# Patient Record
Sex: Female | Born: 1935 | Race: Black or African American | Hispanic: No | Marital: Single | Smoking: Never smoker
Health system: Southern US, Community
[De-identification: ages and names within clinical notes are randomized; demographics above are authoritative.]

## PROBLEM LIST (undated history)

## (undated) DIAGNOSIS — E785 Hyperlipidemia, unspecified: Secondary | ICD-10-CM

## (undated) DIAGNOSIS — R21 Rash and other nonspecific skin eruption: Secondary | ICD-10-CM

## (undated) DIAGNOSIS — D649 Anemia, unspecified: Secondary | ICD-10-CM

## (undated) DIAGNOSIS — I10 Essential (primary) hypertension: Secondary | ICD-10-CM

## (undated) DIAGNOSIS — M1990 Unspecified osteoarthritis, unspecified site: Secondary | ICD-10-CM

## (undated) DIAGNOSIS — E119 Type 2 diabetes mellitus without complications: Secondary | ICD-10-CM

## (undated) HISTORY — PX: HAND SURGERY: SHX662

## (undated) HISTORY — PX: BREAST LUMPECTOMY: SHX2

## (undated) MED FILL — Ferumoxytol Inj 510 MG/17ML (30 MG/ML) (Elemental Fe): INTRAVENOUS | Qty: 17 | Status: AC

---

## 2015-01-23 DIAGNOSIS — M25561 Pain in right knee: Secondary | ICD-10-CM | POA: Diagnosis not present

## 2015-01-27 ENCOUNTER — Other Ambulatory Visit: Payer: Self-pay | Admitting: Surgical

## 2015-02-03 ENCOUNTER — Other Ambulatory Visit: Payer: Self-pay | Admitting: Surgical

## 2015-02-06 NOTE — Patient Instructions (Addendum)
Michelle KiefKatie L Allen  02/06/2015   Your procedure is scheduled on: 02/13/15   [facility] and follow signs to               Short Stay Center at 8:30 AM.   Call this number if you have problems the morning of surgery [phone]   Remember:  Do not eat food or drink liquids :After Midnight.     Take these medicines the morning of surgery with A SIP OF WATER: MAY TAKE TRAMADOL IF NEEDED FOR PAIN                               You may not have any metal on your body including hair pins and              piercings  Do not wear jewelry, make-up, lotions, powders or perfumes.             Do not wear nail polish.  Do not shave  48 hours prior to surgery.              Men may shave face and neck.   Do not bring valuables to the hospital. [facility] IS NOT             RESPONSIBLE   FOR VALUABLES.  Contacts, dentures or bridgework may not be worn into surgery.  Leave suitcase in the car. After surgery it may be brought to your room.     Patients discharged the day of surgery will not be allowed to drive home.  Name and phone number of your driver:  Special Instructions: N/A              Please read over the following fact sheets you were given: _____________________________________________________________________                                                      - PREPARING FOR SURGERY  Before surgery, you can play an important role.  Because skin is not sterile, your skin needs to be as free of germs as possible.  You can reduce the number of germs on your skin by washing with CHG (chlorahexidine gluconate) soap before surgery.  CHG is an antiseptic cleaner which kills germs and bonds with the skin to continue killing germs even after washing. Please DO NOT use if you have an allergy to CHG or antibacterial soaps.  If your skin becomes reddened/irritated stop using the CHG and inform your nurse when you arrive at Short Stay. Do  not shave (including legs and underarms) for at least 48 hours prior to the first CHG shower.  You may shave your face. Please follow these instructions carefully:   1.  Shower with CHG Soap the night before surgery and the  morning of Surgery.   2.  If you choose to wash your hair, wash your hair first as usual with your  normal  Shampoo.   3.  After you shampoo, rinse your hair and body thoroughly to remove the  shampoo.  4.  Use CHG as you would any other liquid soap.  You can apply chg directly  to the skin and wash . Gently wash with scrungie or clean wascloth    5.  Apply the CHG Soap to your body ONLY FROM THE NECK DOWN.   Do not use on open                           Wound or open sores. Avoid contact with eyes, ears mouth and genitals (private parts).                        Genitals (private parts) with your normal soap.              6.  Wash thoroughly, paying special attention to the area where your surgery  will be performed.   7.  Thoroughly rinse your body with warm water from the neck down.   8.  DO NOT shower/wash with your normal soap after using and rinsing off  the CHG Soap .                9.  Pat yourself dry with a clean towel.             10.  Wear clean pajamas.             11.  Place clean sheets on your bed the night of your first shower and do not  sleep with pets.  Day of Surgery : Do not apply any lotions/deodorants the morning of surgery.  Please wear clean clothes to the hospital/surgery center.  FAILURE TO FOLLOW THESE INSTRUCTIONS MAY RESULT IN THE CANCELLATION OF YOUR SURGERY    PATIENT SIGNATURE_________________________________  ______________________________________________________________________     Michelle Allen  An incentive spirometer is a tool that can help keep your lungs clear and active. This tool measures how well you are filling your lungs with each breath. Taking long deep breaths  may help reverse or decrease the chance of developing breathing (pulmonary) problems (especially infection) following:  A long period of time when you are unable to move or be active. BEFORE THE PROCEDURE   If the spirometer includes an indicator to show your best effort, your nurse or respiratory therapist will set it to a desired goal.  If possible, sit up straight or lean slightly forward. Try not to slouch.  Hold the incentive spirometer in an upright position. INSTRUCTIONS FOR USE   Sit on the edge of your bed if possible, or sit up as far as you can in bed or on a chair.  Hold the incentive spirometer in an upright position.  Breathe out normally.  Place the mouthpiece in your mouth and seal your lips tightly around it.  Breathe in slowly and as deeply as possible, raising the piston or the ball toward the top of the column.  Hold your breath for 3-5 seconds or for as long as possible. Allow the piston or ball to fall to the bottom of the column.  Remove the mouthpiece from your mouth and breathe out normally.  Rest for a few seconds and repeat Steps 1 through 7 at least 10 times every 1-2 hours when you are awake. Take your time and take a few normal breaths between deep breaths.  The spirometer may include an indicator to show your best effort. Use the indicator as a goal to work toward during  each repetition.  After each set of 10 deep breaths, practice coughing to be sure your lungs are clear. If you have an incision (the cut made at the time of surgery), support your incision when coughing by placing a pillow or rolled up towels firmly against it. Once you are able to get out of bed, walk around indoors and cough well. You may stop using the incentive spirometer when instructed by your caregiver.  RISKS AND COMPLICATIONS  Take your time so you do not get dizzy or light-headed.  If you are in pain, you may need to take or ask for pain medication before doing incentive  spirometry. It is harder to take a deep breath if you are having pain. AFTER USE  Rest and breathe slowly and easily.  It can be helpful to keep track of a log of your progress. Your caregiver can provide you with a simple table to help with this. If you are using the spirometer at home, follow these instructions: SEEK MEDICAL CARE IF:   You are having difficultly using the spirometer.  You have trouble using the spirometer as often as instructed.  Your pain medication is not giving enough relief while using the spirometer.  You develop fever of 100.5 F (38.1 C) or higher. SEEK IMMEDIATE MEDICAL CARE IF:   You cough up bloody sputum that had not been present before.  You develop fever of 102 F (38.9 C) or greater.  You develop worsening pain at or near the incision site. MAKE SURE YOU:   Understand these instructions.  Will watch your condition.  Will get help right away if you are not doing well or get worse. Document Released: 04/17/2007 Document Revised: 02/27/2012 Document Reviewed: 06/18/2007 ExitCare Patient Information 2014 [employer], [place].   ________________________________________________________________________  WHAT IS A BLOOD TRANSFUSION? Blood Transfusion Information  A transfusion is the replacement of blood or some of its parts. Blood is made up of multiple cells which provide different functions.  Red blood cells carry oxygen and are used for blood loss replacement.  White blood cells fight against infection.  Platelets control bleeding.  Plasma helps clot blood.  Other blood products are available for specialized needs, such as hemophilia or other clotting disorders. BEFORE THE TRANSFUSION  Who gives blood for transfusions?   Healthy volunteers who are fully evaluated to make sure their blood is safe. This is blood bank blood. Transfusion therapy is the safest it has ever been in the practice of medicine. Before blood is taken from a donor, a  complete history is taken to make sure that person has no history of diseases nor engages in risky social behavior (examples are intravenous drug use or sexual activity with multiple partners). The donor's travel history is screened to minimize risk of transmitting infections, such as malaria. The donated blood is tested for signs of infectious diseases, such as HIV and hepatitis. The blood is then tested to be sure it is compatible with you in order to minimize the chance of a transfusion reaction. If you or a relative donates blood, this is often done in anticipation of surgery and is not appropriate for emergency situations. It takes many days to process the donated blood. RISKS AND COMPLICATIONS Although transfusion therapy is very safe and saves many lives, the main dangers of transfusion include:   Getting an infectious disease.  Developing a transfusion reaction. This is an allergic reaction to something in the blood you were given. Every precaution is taken to prevent  this. The decision to have a blood transfusion has been considered carefully by your caregiver before blood is given. Blood is not given unless the benefits outweigh the risks. AFTER THE TRANSFUSION  Right after receiving a blood transfusion, you will usually feel much better and more energetic. This is especially true if your red blood cells have gotten low (anemic). The transfusion raises the level of the red blood cells which carry oxygen, and this usually causes an energy increase.  The nurse administering the transfusion will monitor you carefully for complications. HOME CARE INSTRUCTIONS  No special instructions are needed after a transfusion. You may find your energy is better. Speak with your caregiver about any limitations on activity for underlying diseases you may have. SEEK MEDICAL CARE IF:   Your condition is not improving after your transfusion.  You develop redness or irritation at the intravenous (IV)  site. SEEK IMMEDIATE MEDICAL CARE IF:  Any of the following symptoms occur over the next 12 hours:  Shaking chills.  You have a temperature by mouth above 102 F (38.9 C), not controlled by medicine.  Chest, back, or muscle pain.  People around you feel you are not acting correctly or are confused.  Shortness of breath or difficulty breathing.  Dizziness and fainting.  You get a rash or develop hives.  You have a decrease in urine output.  Your urine turns a dark color or changes to pink, red, or brown. Any of the following symptoms occur over the next 10 days:  You have a temperature by mouth above 102 F (38.9 C), not controlled by medicine.  Shortness of breath.  Weakness after normal activity.  The white part of the eye turns yellow (jaundice).  You have a decrease in the amount of urine or are urinating less often.  Your urine turns a dark color or changes to pink, red, or brown. Document Released: 12/02/2000 Document Revised: 02/27/2012 Document Reviewed: 07/21/2008 [facility] Patient Information 2014 [place], [place].  _______________________________________________________________________

## 2015-02-09 ENCOUNTER — Encounter (HOSPITAL_COMMUNITY)
Admission: RE | Admit: 2015-02-09 | Discharge: 2015-02-09 | Disposition: A | Discharge status: Home / Self Care | Payer: PRIVATE HEALTH INSURANCE | Source: Ambulatory Visit | Attending: Orthopedic Surgery | Admitting: Orthopedic Surgery

## 2015-02-09 ENCOUNTER — Encounter (HOSPITAL_COMMUNITY): Payer: Self-pay

## 2015-02-09 ENCOUNTER — Ambulatory Visit (HOSPITAL_COMMUNITY)
Admission: RE | Admit: 2015-02-09 | Discharge: 2015-02-09 | Disposition: A | Discharge status: Home / Self Care | Payer: PRIVATE HEALTH INSURANCE | Source: Ambulatory Visit | Attending: Surgical | Admitting: Surgical

## 2015-02-09 DIAGNOSIS — Z01818 Encounter for other preprocedural examination: Secondary | ICD-10-CM | POA: Diagnosis not present

## 2015-02-09 DIAGNOSIS — D509 Iron deficiency anemia, unspecified: Secondary | ICD-10-CM | POA: Diagnosis not present

## 2015-02-09 DIAGNOSIS — I700 Atherosclerosis of aorta: Secondary | ICD-10-CM | POA: Insufficient documentation

## 2015-02-09 DIAGNOSIS — Z01812 Encounter for preprocedural laboratory examination: Secondary | ICD-10-CM | POA: Insufficient documentation

## 2015-02-09 DIAGNOSIS — I10 Essential (primary) hypertension: Secondary | ICD-10-CM | POA: Diagnosis not present

## 2015-02-09 DIAGNOSIS — E1149 Type 2 diabetes mellitus with other diabetic neurological complication: Secondary | ICD-10-CM | POA: Diagnosis not present

## 2015-02-09 HISTORY — DX: Type 2 diabetes mellitus without complications: E11.9

## 2015-02-09 HISTORY — DX: Rash and other nonspecific skin eruption: R21

## 2015-02-09 HISTORY — DX: Unspecified osteoarthritis, unspecified site: M19.90

## 2015-02-09 HISTORY — DX: Essential (primary) hypertension: I10

## 2015-02-09 HISTORY — DX: Anemia, unspecified: D64.9

## 2015-02-09 HISTORY — DX: Hyperlipidemia, unspecified: E78.5

## 2015-02-09 LAB — URINALYSIS, ROUTINE W REFLEX MICROSCOPIC
Bilirubin Urine: NEGATIVE
Glucose, UA: NEGATIVE mg/dL
Hgb urine dipstick: NEGATIVE
Ketones, ur: NEGATIVE mg/dL
Leukocytes, UA: NEGATIVE
Nitrite: NEGATIVE
Protein, ur: NEGATIVE mg/dL
Specific Gravity, Urine: 1.018 (ref 1.005–1.030)
Urobilinogen, UA: 1 mg/dL (ref 0.0–1.0)
pH: 7 (ref 5.0–8.0)

## 2015-02-09 LAB — COMPREHENSIVE METABOLIC PANEL
ALT: 16 U/L (ref 0–35)
AST: 25 U/L (ref 0–37)
Albumin: 4 g/dL (ref 3.5–5.2)
Alkaline Phosphatase: 94 U/L (ref 39–117)
Anion gap: 6 (ref 5–15)
BUN: 26 mg/dL — ABNORMAL HIGH (ref 6–23)
CO2: 25 mmol/L (ref 19–32)
Calcium: 9 mg/dL (ref 8.4–10.5)
Chloride: 106 mmol/L (ref 96–112)
Creatinine, Ser: 1.2 mg/dL — ABNORMAL HIGH (ref 0.50–1.10)
GFR calc Af Amer: 48 mL/min — ABNORMAL LOW (ref >90)
GFR calc non Af Amer: 42 mL/min — ABNORMAL LOW (ref >90)
Glucose, Bld: 85 mg/dL (ref 70–99)
Potassium: 3.8 mmol/L (ref 3.5–5.1)
Sodium: 137 mmol/L (ref 135–145)
Total Bilirubin: 0.5 mg/dL (ref 0.3–1.2)
Total Protein: 7.8 g/dL (ref 6.0–8.3)

## 2015-02-09 LAB — SURGICAL PCR SCREEN
MRSA, PCR: POSITIVE — AB
STAPHYLOCOCCUS AUREUS: POSITIVE — AB

## 2015-02-09 LAB — CBC WITH DIFFERENTIAL/PLATELET
Basophils Absolute: 0 10*3/uL (ref 0.0–0.1)
Basophils Relative: 0 % (ref 0–1)
Eosinophils Absolute: 0.1 10*3/uL (ref 0.0–0.7)
Eosinophils Relative: 2 % (ref 0–5)
HCT: 33.6 % — ABNORMAL LOW (ref 36.0–46.0)
Hemoglobin: 10.8 g/dL — ABNORMAL LOW (ref 12.0–15.0)
Lymphocytes Relative: 33 % (ref 12–46)
Lymphs Abs: 2 10*3/uL (ref 0.7–4.0)
MCH: 26.3 pg (ref 26.0–34.0)
MCHC: 32.1 g/dL (ref 30.0–36.0)
MCV: 82 fL (ref 78.0–100.0)
Monocytes Absolute: 0.7 10*3/uL (ref 0.1–1.0)
Monocytes Relative: 11 % (ref 3–12)
Neutro Abs: 3.2 10*3/uL (ref 1.7–7.7)
Neutrophils Relative %: 54 % (ref 43–77)
Platelets: 252 10*3/uL (ref 150–400)
RBC: 4.1 MIL/uL (ref 3.87–5.11)
RDW: 14.8 % (ref 11.5–15.5)
WBC: 5.9 10*3/uL (ref 4.0–10.5)

## 2015-02-09 LAB — APTT: aPTT: 27 seconds (ref 24–37)

## 2015-02-09 LAB — PROTIME-INR
INR: 1.04 (ref 0.00–1.49)
Prothrombin Time: 13.7 seconds (ref 11.6–15.2)

## 2015-02-09 LAB — ABO/RH: ABO/RH(D): B POS

## 2015-02-10 NOTE — Progress Notes (Signed)
Instructions given to son for pt to get Rx mupuricin at [facility]. Rx called to pharmacy

## 2015-02-10 NOTE — H&P (Signed)
TOTAL KNEE ADMISSION H&P  Patient is being admitted for right total knee arthroplasty.  Subjective:  Chief Complaint:right knee pain.  HPI: Michelle Allen, 81 y.o. female, has a history of pain and functional disability in the right knee due to arthritis and has failed non-surgical conservative treatments for greater than 12 weeks to includeNSAID's and/or analgesics, corticosteriod injections, flexibility and strengthening excercises and activity modification.  Onset of symptoms was gradual, starting 5 years ago with gradually worsening course since that time. The patient noted no past surgery on the right knee(s).  Patient currently rates pain in the right knee(s) at 8 out of 10 with activity. Patient has night pain, worsening of pain with activity and weight bearing, pain that interferes with activities of daily living, pain with passive range of motion, crepitus and joint swelling.  Patient has evidence of periarticular osteophytes and joint space narrowing by imaging studies. There is no active infection.  Past Medical History  Diagnosis Date  . Hypertension   . Hyperlipidemia   . Arthritis   . Rash     rt side of neck  . Anemia   . Diabetes mellitus without complication     Past Surgical History  Procedure Laterality Date  . Cesarean section    . Hand surgery  44 yrs ago    left  . Breast lumpectomy  44 yrs ago    benign     Current outpatient prescriptions:  .  ferrous fumarate (HEMOCYTE - 106 MG FE) 325 (106 FE) MG TABS tablet, Take 1 tablet by mouth daily., Disp: , Rfl:  .  glimepiride (AMARYL) 4 MG tablet, Take 4 mg by mouth daily with breakfast., Disp: , Rfl:  .  losartan-hydrochlorothiazide (HYZAAR) 100-25 MG per tablet, Take 1 tablet by mouth every morning., Disp: , Rfl:  .  meclizine (ANTIVERT) 25 MG tablet, Take 25 mg by mouth 3 (three) times daily as needed for dizziness., Disp: , Rfl:  .  Omega-3 Fatty Acids (OMEGA 3 PO), Take 1 capsule by mouth daily., Disp: , Rfl:   .  traMADol (ULTRAM) 50 MG tablet, Take 50 mg by mouth every 6 (six) hours as needed for moderate pain., Disp: , Rfl:   Allergies  Allergen Reactions  . Penicillins Rash    History  Substance Use Topics  . Smoking status: Never Smoker   . Smokeless tobacco: Not on file  . Alcohol Use: Yes     Comment: very rare     Review of Systems  Constitutional: Positive for diaphoresis. Negative for fever, chills, weight loss and malaise/fatigue.  HENT: Negative.   Eyes: Negative.   Respiratory: Positive for shortness of breath. Negative for cough, hemoptysis, sputum production and wheezing.   Cardiovascular: Negative.   Gastrointestinal: Negative.   Genitourinary: Negative.   Musculoskeletal: Positive for joint pain. Negative for myalgias, back pain, falls and neck pain.       Right knee pain  Skin: Negative.   Neurological: Positive for dizziness. Negative for tingling, tremors, sensory change, speech change, focal weakness, seizures, loss of consciousness and weakness.  Endo/Heme/Allergies: Negative.   Psychiatric/Behavioral: Negative.     Objective:  Physical Exam  Constitutional: She is oriented to person, place, and time. She appears well-developed and well-nourished. No distress.  HENT:  Head: Normocephalic and atraumatic.  Right Ear: External ear normal.  Left Ear: External ear normal.  Nose: Nose normal.  Mouth/Throat: Oropharynx is clear and moist.  Eyes: Conjunctivae and EOM are normal.  Neck: Normal  range of motion. Neck supple.  Cardiovascular: Normal rate, regular rhythm, normal heart sounds and intact distal pulses.   No murmur heard. Respiratory: Effort normal and breath sounds normal. No respiratory distress. She has no wheezes.  GI: Soft. Bowel sounds are normal. She exhibits no distension. There is no tenderness.  Musculoskeletal:       Right hip: Normal.       Left hip: Normal.       Right knee: She exhibits decreased range of motion, swelling and effusion.  She exhibits no erythema. Tenderness found. Medial joint line and lateral joint line tenderness noted.       Left knee: Normal.       Right lower leg: She exhibits no tenderness and no swelling.       Left lower leg: She exhibits no tenderness and no swelling.  Neurological: She is alert and oriented to person, place, and time. She has normal strength and normal reflexes. No sensory deficit.  Skin: No rash noted. She is not diaphoretic. No erythema.  Psychiatric: She has a normal mood and affect. Her behavior is normal.    Vitals  Weight: 153 lb Height: 62in Body Surface Area: 1.71 m Body Mass Index: 27.98 kg/m  Pulse: 76 (Regular)  BP: 132/78 (Sitting, Left Arm, Standard)  Imaging Review Plain radiographs demonstrate severe degenerative joint disease of the right knee(s). The overall alignment ismild varus. The bone quality appears to be good for age and reported activity level.  Assessment/Plan:  End stage primary osteoarthritis, right knee   The patient history, physical examination, clinical judgment of the provider and imaging studies are consistent with end stage degenerative joint disease of the right knee(s) and total knee arthroplasty is deemed medically necessary. The treatment options including medical management, injection therapy arthroscopy and arthroplasty were discussed at length. The risks and benefits of total knee arthroplasty were presented and reviewed. The risks due to aseptic loosening, infection, stiffness, patella tracking problems, thromboembolic complications and other imponderables were discussed. The patient acknowledged the explanation, agreed to proceed with the plan and consent was signed. Patient is being admitted for inpatient treatment for surgery, pain control, PT, OT, prophylactic antibiotics, VTE prophylaxis, progressive ambulation and ADL's and discharge planning. The patient is planning to be discharged to skilled nursing facility ([facility])    TXA IV  PCP: Amy Moon    Dimitri Ped, [place]

## 2015-02-11 NOTE — Progress Notes (Signed)
Medical clearance note Michelle Allen gnp on chart for 02-13-15 surgery

## 2015-02-13 ENCOUNTER — Encounter (HOSPITAL_COMMUNITY): Payer: Self-pay | Admitting: Certified Registered"

## 2015-02-13 ENCOUNTER — Inpatient Hospital Stay (HOSPITAL_COMMUNITY)
Admission: RE | Admit: 2015-02-13 | Discharge: 2015-02-16 | DRG: 470 | Disposition: A | Discharge status: Skilled Nursing Facility | Payer: PRIVATE HEALTH INSURANCE | Source: Ambulatory Visit | Attending: Orthopedic Surgery | Admitting: Orthopedic Surgery

## 2015-02-13 ENCOUNTER — Inpatient Hospital Stay (HOSPITAL_COMMUNITY): Payer: PRIVATE HEALTH INSURANCE | Admitting: Anesthesiology

## 2015-02-13 ENCOUNTER — Encounter (HOSPITAL_COMMUNITY)
Admission: RE | Disposition: A | Discharge status: Skilled Nursing Facility | Payer: Self-pay | Source: Ambulatory Visit | Attending: Orthopedic Surgery

## 2015-02-13 DIAGNOSIS — E785 Hyperlipidemia, unspecified: Secondary | ICD-10-CM | POA: Diagnosis not present

## 2015-02-13 DIAGNOSIS — D649 Anemia, unspecified: Secondary | ICD-10-CM | POA: Diagnosis not present

## 2015-02-13 DIAGNOSIS — Z79899 Other long term (current) drug therapy: Secondary | ICD-10-CM

## 2015-02-13 DIAGNOSIS — M1711 Unilateral primary osteoarthritis, right knee: Secondary | ICD-10-CM | POA: Diagnosis not present

## 2015-02-13 DIAGNOSIS — E119 Type 2 diabetes mellitus without complications: Secondary | ICD-10-CM | POA: Diagnosis present

## 2015-02-13 DIAGNOSIS — R21 Rash and other nonspecific skin eruption: Secondary | ICD-10-CM | POA: Diagnosis not present

## 2015-02-13 DIAGNOSIS — Z96659 Presence of unspecified artificial knee joint: Secondary | ICD-10-CM

## 2015-02-13 DIAGNOSIS — M179 Osteoarthritis of knee, unspecified: Secondary | ICD-10-CM | POA: Diagnosis not present

## 2015-02-13 DIAGNOSIS — I10 Essential (primary) hypertension: Secondary | ICD-10-CM | POA: Diagnosis present

## 2015-02-13 DIAGNOSIS — Z96651 Presence of right artificial knee joint: Secondary | ICD-10-CM | POA: Diagnosis not present

## 2015-02-13 DIAGNOSIS — Z471 Aftercare following joint replacement surgery: Secondary | ICD-10-CM | POA: Diagnosis not present

## 2015-02-13 DIAGNOSIS — M1990 Unspecified osteoarthritis, unspecified site: Secondary | ICD-10-CM | POA: Diagnosis not present

## 2015-02-13 DIAGNOSIS — M25561 Pain in right knee: Secondary | ICD-10-CM | POA: Diagnosis present

## 2015-02-13 HISTORY — PX: TOTAL KNEE ARTHROPLASTY: SHX125

## 2015-02-13 LAB — GLUCOSE, CAPILLARY
GLUCOSE-CAPILLARY: 183 mg/dL — AB (ref 70–99)
GLUCOSE-CAPILLARY: 200 mg/dL — AB (ref 70–99)
Glucose-Capillary: 84 mg/dL (ref 70–99)
Glucose-Capillary: 168 mg/dL — ABNORMAL HIGH (ref 70–99)

## 2015-02-13 LAB — TYPE AND SCREEN
ABO/RH(D): B POS
Antibody Screen: NEGATIVE

## 2015-02-13 SURGERY — ARTHROPLASTY, KNEE, TOTAL
Anesthesia: General | Site: Knee | Laterality: Right

## 2015-02-13 MED ORDER — ACETAMINOPHEN 10 MG/ML IV SOLN
1000.0000 mg | Freq: Once | INTRAVENOUS | Status: AC
  Administered 2015-02-13: 1000 mg via INTRAVENOUS
  Filled 2015-02-13: qty 100

## 2015-02-13 MED ORDER — LACTATED RINGERS IV SOLN
INTRAVENOUS | Status: DC
  Administered 2015-02-13 (×2): via INTRAVENOUS

## 2015-02-13 MED ORDER — LIDOCAINE HCL (PF) 2 % IJ SOLN
INTRAMUSCULAR | Status: DC | PRN
  Administered 2015-02-13: 20 mg via INTRADERMAL

## 2015-02-13 MED ORDER — PROPOFOL 10 MG/ML IV BOLUS
INTRAVENOUS | Status: DC | PRN
  Administered 2015-02-13: 100 mg via INTRAVENOUS

## 2015-02-13 MED ORDER — ACETAMINOPHEN 325 MG PO TABS: 650.0000 mg | ORAL_TABLET | Freq: Four times a day (QID) | ORAL | Status: DC | PRN

## 2015-02-13 MED ORDER — LOSARTAN POTASSIUM-HCTZ 100-25 MG PO TABS: 1.0000 | ORAL_TABLET | Freq: Every morning | ORAL | Status: DC

## 2015-02-13 MED ORDER — ONDANSETRON HCL 4 MG/2ML IJ SOLN
INTRAMUSCULAR | Status: AC
  Filled 2015-02-13: qty 2

## 2015-02-13 MED ORDER — ONDANSETRON HCL 4 MG/2ML IJ SOLN: 4.0000 mg | Freq: Four times a day (QID) | INTRAMUSCULAR | Status: DC | PRN

## 2015-02-13 MED ORDER — HYDROCODONE-ACETAMINOPHEN 5-325 MG PO TABS: 1.0000 | ORAL_TABLET | ORAL | Status: DC | PRN

## 2015-02-13 MED ORDER — BUPIVACAINE HCL (PF) 0.25 % IJ SOLN
INTRAMUSCULAR | Status: DC | PRN
  Administered 2015-02-13: 20 mL

## 2015-02-13 MED ORDER — ONDANSETRON HCL 4 MG/2ML IJ SOLN
INTRAMUSCULAR | Status: DC | PRN
  Administered 2015-02-13: 4 mg via INTRAVENOUS

## 2015-02-13 MED ORDER — HYDROMORPHONE HCL 2 MG/ML IJ SOLN
INTRAMUSCULAR | Status: AC
  Filled 2015-02-13: qty 1

## 2015-02-13 MED ORDER — POLYETHYLENE GLYCOL 3350 17 G PO PACK: 17.0000 g | PACK | Freq: Every day | ORAL | Status: DC | PRN

## 2015-02-13 MED ORDER — HYDROMORPHONE HCL 1 MG/ML IJ SOLN
1.0000 mg | INTRAMUSCULAR | Status: DC | PRN
  Administered 2015-02-13: 1 mg via INTRAVENOUS
  Filled 2015-02-13: qty 1

## 2015-02-13 MED ORDER — VANCOMYCIN HCL IN DEXTROSE 1-5 GM/200ML-% IV SOLN
1000.0000 mg | Freq: Two times a day (BID) | INTRAVENOUS | Status: AC
  Administered 2015-02-13: 1000 mg via INTRAVENOUS
  Filled 2015-02-13: qty 200

## 2015-02-13 MED ORDER — SUCCINYLCHOLINE CHLORIDE 20 MG/ML IJ SOLN
INTRAMUSCULAR | Status: DC | PRN
  Administered 2015-02-13: 80 mg via INTRAVENOUS

## 2015-02-13 MED ORDER — THROMBIN 5000 UNITS EX SOLR
OROMUCOSAL | Status: DC | PRN
  Administered 2015-02-13: 12:00:00 via TOPICAL

## 2015-02-13 MED ORDER — VANCOMYCIN HCL 1000 MG IV SOLR
1000.0000 mg | Freq: Once | INTRAVENOUS | Status: AC
  Administered 2015-02-13: 1000 mg via INTRAVENOUS

## 2015-02-13 MED ORDER — PROPOFOL 10 MG/ML IV BOLUS
INTRAVENOUS | Status: AC
  Filled 2015-02-13: qty 20

## 2015-02-13 MED ORDER — HYDROMORPHONE HCL 1 MG/ML IJ SOLN: 0.2500 mg | INTRAMUSCULAR | Status: DC | PRN

## 2015-02-13 MED ORDER — MECLIZINE HCL 25 MG PO TABS
25.0000 mg | ORAL_TABLET | Freq: Three times a day (TID) | ORAL | Status: DC | PRN
  Filled 2015-02-13: qty 1

## 2015-02-13 MED ORDER — ACETAMINOPHEN 650 MG RE SUPP: 650.0000 mg | Freq: Four times a day (QID) | RECTAL | Status: DC | PRN

## 2015-02-13 MED ORDER — CEFAZOLIN SODIUM-DEXTROSE 2-3 GM-% IV SOLR: 2.0000 g | INTRAVENOUS | Status: DC

## 2015-02-13 MED ORDER — HYDROCHLOROTHIAZIDE 25 MG PO TABS
25.0000 mg | ORAL_TABLET | Freq: Every day | ORAL | Status: DC
  Administered 2015-02-13 – 2015-02-16 (×4): 25 mg via ORAL
  Filled 2015-02-13 (×4): qty 1

## 2015-02-13 MED ORDER — CELECOXIB 200 MG PO CAPS
200.0000 mg | ORAL_CAPSULE | Freq: Two times a day (BID) | ORAL | Status: DC
  Administered 2015-02-13 – 2015-02-16 (×5): 200 mg via ORAL
  Filled 2015-02-13 (×7): qty 1

## 2015-02-13 MED ORDER — FENTANYL CITRATE 0.05 MG/ML IJ SOLN
INTRAMUSCULAR | Status: DC | PRN
  Administered 2015-02-13: 100 ug via INTRAVENOUS

## 2015-02-13 MED ORDER — TRANEXAMIC ACID 100 MG/ML IV SOLN
1000.0000 mg | INTRAVENOUS | Status: AC
  Administered 2015-02-13: 1000 mg via INTRAVENOUS
  Filled 2015-02-13: qty 10

## 2015-02-13 MED ORDER — VANCOMYCIN HCL IN DEXTROSE 1-5 GM/200ML-% IV SOLN
INTRAVENOUS | Status: AC
  Filled 2015-02-13: qty 200

## 2015-02-13 MED ORDER — METHOCARBAMOL 500 MG PO TABS
500.0000 mg | ORAL_TABLET | Freq: Four times a day (QID) | ORAL | Status: DC | PRN
  Administered 2015-02-16: 500 mg via ORAL
  Filled 2015-02-13: qty 1

## 2015-02-13 MED ORDER — FENTANYL CITRATE 0.05 MG/ML IJ SOLN: 25.0000 ug | INTRAMUSCULAR | Status: DC | PRN

## 2015-02-13 MED ORDER — DEXAMETHASONE SODIUM PHOSPHATE 10 MG/ML IJ SOLN
INTRAMUSCULAR | Status: AC
  Filled 2015-02-13: qty 1

## 2015-02-13 MED ORDER — BUPIVACAINE LIPOSOME 1.3 % IJ SUSP
20.0000 mL | Freq: Once | INTRAMUSCULAR | Status: AC
  Administered 2015-02-13: 20 mL
  Filled 2015-02-13: qty 20

## 2015-02-13 MED ORDER — ALUM & MAG HYDROXIDE-SIMETH 200-200-20 MG/5ML PO SUSP: 30.0000 mL | ORAL | Status: DC | PRN

## 2015-02-13 MED ORDER — SODIUM CHLORIDE 0.9 % IJ SOLN
INTRAMUSCULAR | Status: AC
  Filled 2015-02-13: qty 50

## 2015-02-13 MED ORDER — SODIUM CHLORIDE 0.9 % IR SOLN
Status: DC | PRN
  Administered 2015-02-13: 1000 mL

## 2015-02-13 MED ORDER — FENTANYL CITRATE 0.05 MG/ML IJ SOLN
INTRAMUSCULAR | Status: AC
  Filled 2015-02-13: qty 2

## 2015-02-13 MED ORDER — ONDANSETRON HCL 4 MG PO TABS: 4.0000 mg | ORAL_TABLET | Freq: Four times a day (QID) | ORAL | Status: DC | PRN

## 2015-02-13 MED ORDER — BUPIVACAINE HCL (PF) 0.25 % IJ SOLN
INTRAMUSCULAR | Status: AC
  Filled 2015-02-13: qty 30

## 2015-02-13 MED ORDER — BISACODYL 5 MG PO TBEC: 5.0000 mg | DELAYED_RELEASE_TABLET | Freq: Every day | ORAL | Status: DC | PRN

## 2015-02-13 MED ORDER — RIVAROXABAN 10 MG PO TABS
10.0000 mg | ORAL_TABLET | Freq: Every day | ORAL | Status: DC
  Administered 2015-02-14 – 2015-02-16 (×3): 10 mg via ORAL
  Filled 2015-02-13 (×4): qty 1

## 2015-02-13 MED ORDER — GLIMEPIRIDE 4 MG PO TABS
4.0000 mg | ORAL_TABLET | Freq: Every day | ORAL | Status: DC
  Administered 2015-02-14 – 2015-02-16 (×3): 4 mg via ORAL
  Filled 2015-02-13 (×4): qty 1

## 2015-02-13 MED ORDER — FERROUS SULFATE 325 (65 FE) MG PO TABS
325.0000 mg | ORAL_TABLET | Freq: Three times a day (TID) | ORAL | Status: DC
  Administered 2015-02-13 – 2015-02-16 (×8): 325 mg via ORAL
  Filled 2015-02-13 (×11): qty 1

## 2015-02-13 MED ORDER — INSULIN ASPART 100 UNIT/ML ~~LOC~~ SOLN
0.0000 [IU] | Freq: Three times a day (TID) | SUBCUTANEOUS | Status: DC
  Administered 2015-02-13: 3 [IU] via SUBCUTANEOUS
  Administered 2015-02-14 – 2015-02-15 (×3): 2 [IU] via SUBCUTANEOUS

## 2015-02-13 MED ORDER — METHOCARBAMOL 1000 MG/10ML IJ SOLN
500.0000 mg | Freq: Four times a day (QID) | INTRAVENOUS | Status: DC | PRN
  Administered 2015-02-13: 500 mg via INTRAVENOUS
  Filled 2015-02-13 (×2): qty 5

## 2015-02-13 MED ORDER — SODIUM CHLORIDE 0.9 % IR SOLN
Status: DC | PRN
  Administered 2015-02-13: 500 mL

## 2015-02-13 MED ORDER — CHLORHEXIDINE GLUCONATE 4 % EX LIQD: 60.0000 mL | Freq: Once | CUTANEOUS | Status: DC

## 2015-02-13 MED ORDER — OXYCODONE-ACETAMINOPHEN 5-325 MG PO TABS
2.0000 | ORAL_TABLET | ORAL | Status: DC | PRN
  Administered 2015-02-13 – 2015-02-16 (×5): 2 via ORAL
  Filled 2015-02-13 (×6): qty 2

## 2015-02-13 MED ORDER — HYDROMORPHONE HCL 1 MG/ML IJ SOLN
INTRAMUSCULAR | Status: DC | PRN
  Administered 2015-02-13 (×2): 1 mg via INTRAVENOUS

## 2015-02-13 MED ORDER — MIDAZOLAM HCL 5 MG/5ML IJ SOLN
INTRAMUSCULAR | Status: DC | PRN
  Administered 2015-02-13 (×2): 1 mg via INTRAVENOUS

## 2015-02-13 MED ORDER — LOSARTAN POTASSIUM 50 MG PO TABS
100.0000 mg | ORAL_TABLET | Freq: Every day | ORAL | Status: DC
  Administered 2015-02-13 – 2015-02-16 (×4): 100 mg via ORAL
  Filled 2015-02-13 (×4): qty 2

## 2015-02-13 MED ORDER — SODIUM CHLORIDE 0.9 % IJ SOLN
INTRAMUSCULAR | Status: DC | PRN
  Administered 2015-02-13: 20 mL via INTRAVENOUS

## 2015-02-13 MED ORDER — LACTATED RINGERS IV SOLN
INTRAVENOUS | Status: DC
  Administered 2015-02-13 – 2015-02-14 (×2): via INTRAVENOUS

## 2015-02-13 MED ORDER — MENTHOL 3 MG MT LOZG: 1.0000 | LOZENGE | OROMUCOSAL | Status: DC | PRN

## 2015-02-13 MED ORDER — FLEET ENEMA 7-19 GM/118ML RE ENEM: 1.0000 | ENEMA | Freq: Once | RECTAL | Status: AC | PRN

## 2015-02-13 MED ORDER — PHENOL 1.4 % MT LIQD: 1.0000 | OROMUCOSAL | Status: DC | PRN

## 2015-02-13 MED ORDER — LACTATED RINGERS IV SOLN: INTRAVENOUS | Status: DC

## 2015-02-13 MED ORDER — DEXAMETHASONE SODIUM PHOSPHATE 10 MG/ML IJ SOLN
INTRAMUSCULAR | Status: DC | PRN
  Administered 2015-02-13: 10 mg via INTRAVENOUS

## 2015-02-13 MED ORDER — ROCURONIUM BROMIDE 100 MG/10ML IV SOLN
INTRAVENOUS | Status: DC | PRN
  Administered 2015-02-13: 20 mg via INTRAVENOUS

## 2015-02-13 MED ORDER — MIDAZOLAM HCL 2 MG/2ML IJ SOLN
INTRAMUSCULAR | Status: AC
  Filled 2015-02-13: qty 2

## 2015-02-13 MED ORDER — THROMBIN 5000 UNITS EX SOLR
CUTANEOUS | Status: AC
  Filled 2015-02-13: qty 5000

## 2015-02-13 MED ORDER — FERROUS FUMARATE 325 (106 FE) MG PO TABS: 1.0000 | ORAL_TABLET | Freq: Every day | ORAL | Status: DC

## 2015-02-13 SURGICAL SUPPLY — 74 items
BAG DECANTER FOR FLEXI CONT (MISCELLANEOUS) ×3 IMPLANT
BAG ZIPLOCK 12X15 (MISCELLANEOUS) IMPLANT
BANDAGE ELASTIC 4 VELCRO ST LF (GAUZE/BANDAGES/DRESSINGS) ×3 IMPLANT
BANDAGE ELASTIC 6 VELCRO ST LF (GAUZE/BANDAGES/DRESSINGS) ×3 IMPLANT
BANDAGE ESMARK 6X9 LF (GAUZE/BANDAGES/DRESSINGS) ×1 IMPLANT
BLADE SAG 18X100X1.27 (BLADE) ×3 IMPLANT
BLADE SAW SGTL 11.0X1.19X90.0M (BLADE) ×3 IMPLANT
BNDG ESMARK 6X9 LF (GAUZE/BANDAGES/DRESSINGS) ×3
BONE CEMENT GENTAMICIN (Cement) ×3 IMPLANT
CAP KNEE TOTAL 3 SIGMA ×3 IMPLANT
CEMENT BONE GENTAMICIN 40 (Cement) ×1 IMPLANT
CUFF TOURN SGL QUICK 34 (TOURNIQUET CUFF) ×2
CUFF TRNQT CYL 34X4X40X1 (TOURNIQUET CUFF) ×1 IMPLANT
DERMABOND ADVANCED (GAUZE/BANDAGES/DRESSINGS) ×2
DERMABOND ADVANCED .7 DNX12 (GAUZE/BANDAGES/DRESSINGS) ×1 IMPLANT
DRAPE EXTREMITY T 121X128X90 (DRAPE) ×3 IMPLANT
DRAPE INCISE IOBAN 66X45 STRL (DRAPES) IMPLANT
DRAPE POUCH INSTRU U-SHP 10X18 (DRAPES) ×3 IMPLANT
DRAPE U-SHAPE 47X51 STRL (DRAPES) ×3 IMPLANT
DRSG AQUACEL AG ADV 3.5X10 (GAUZE/BANDAGES/DRESSINGS) ×3 IMPLANT
DRSG OPSITE POSTOP 3X4 (GAUZE/BANDAGES/DRESSINGS) ×3 IMPLANT
DRSG PAD ABDOMINAL 8X10 ST (GAUZE/BANDAGES/DRESSINGS) IMPLANT
DRSG TEGADERM 4X4.75 (GAUZE/BANDAGES/DRESSINGS) ×3 IMPLANT
DURAPREP 26ML APPLICATOR (WOUND CARE) ×3 IMPLANT
ELECT REM PT RETURN 9FT ADLT (ELECTROSURGICAL) ×3
ELECTRODE REM PT RTRN 9FT ADLT (ELECTROSURGICAL) ×1 IMPLANT
EVACUATOR 1/8 PVC DRAIN (DRAIN) ×3 IMPLANT
FACESHIELD WRAPAROUND (MASK) ×21 IMPLANT
GAUZE SPONGE 2X2 8PLY STRL LF (GAUZE/BANDAGES/DRESSINGS) ×1 IMPLANT
GLOVE BIOGEL PI IND STRL 6.5 (GLOVE) ×1 IMPLANT
GLOVE BIOGEL PI IND STRL 8 (GLOVE) ×1 IMPLANT
GLOVE BIOGEL PI INDICATOR 6.5 (GLOVE) ×2
GLOVE BIOGEL PI INDICATOR 8 (GLOVE) ×2
GLOVE ECLIPSE 8.0 STRL XLNG CF (GLOVE) ×6 IMPLANT
GLOVE SURG SS PI 6.5 STRL IVOR (GLOVE) ×3 IMPLANT
GOWN STRL REUS W/TWL LRG LVL3 (GOWN DISPOSABLE) ×3 IMPLANT
GOWN STRL REUS W/TWL XL LVL3 (GOWN DISPOSABLE) ×3 IMPLANT
HANDPIECE INTERPULSE COAX TIP (DISPOSABLE) ×2
IMMOBILIZER KNEE 20 (SOFTGOODS) ×3
IMMOBILIZER KNEE 20 THIGH 36 (SOFTGOODS) ×1 IMPLANT
KIT BASIN OR (CUSTOM PROCEDURE TRAY) ×3 IMPLANT
LIQUID BAND (GAUZE/BANDAGES/DRESSINGS) ×3 IMPLANT
MANIFOLD NEPTUNE II (INSTRUMENTS) ×3 IMPLANT
NDL SAFETY ECLIPSE 18X1.5 (NEEDLE) ×1 IMPLANT
NEEDLE HYPO 18GX1.5 SHARP (NEEDLE) ×2
NEEDLE HYPO 22GX1.5 SAFETY (NEEDLE) ×3 IMPLANT
NS IRRIG 1000ML POUR BTL (IV SOLUTION) IMPLANT
PACK TOTAL JOINT (CUSTOM PROCEDURE TRAY) ×3 IMPLANT
PADDING CAST COTTON 6X4 STRL (CAST SUPPLIES) IMPLANT
PEN SKIN MARKING BROAD (MISCELLANEOUS) ×3 IMPLANT
POSITIONER SURGICAL ARM (MISCELLANEOUS) ×3 IMPLANT
SET HNDPC FAN SPRY TIP SCT (DISPOSABLE) ×1 IMPLANT
SET PAD KNEE POSITIONER (MISCELLANEOUS) ×3 IMPLANT
SPONGE GAUZE 2X2 STER 10/PKG (GAUZE/BANDAGES/DRESSINGS) ×2
SPONGE LAP 18X18 X RAY DECT (DISPOSABLE) IMPLANT
SPONGE SURGIFOAM ABS GEL 100 (HEMOSTASIS) ×3 IMPLANT
STAPLER VISISTAT 35W (STAPLE) IMPLANT
SUCTION FRAZIER 12FR DISP (SUCTIONS) ×3 IMPLANT
SUT BONE WAX W31G (SUTURE) ×3 IMPLANT
SUT MNCRL AB 4-0 PS2 18 (SUTURE) ×3 IMPLANT
SUT VIC AB 1 CT1 27 (SUTURE) ×4
SUT VIC AB 1 CT1 27XBRD ANTBC (SUTURE) ×2 IMPLANT
SUT VIC AB 2-0 CT1 27 (SUTURE) ×6
SUT VIC AB 2-0 CT1 TAPERPNT 27 (SUTURE) ×3 IMPLANT
SUT VLOC 180 0 24IN GS25 (SUTURE) ×3 IMPLANT
SYR 20CC LL (SYRINGE) ×9 IMPLANT
SYR 50ML LL SCALE MARK (SYRINGE) IMPLANT
TOWEL OR 17X26 10 PK STRL BLUE (TOWEL DISPOSABLE) ×3 IMPLANT
TOWEL OR NON WOVEN STRL DISP B (DISPOSABLE) ×3 IMPLANT
TOWER CARTRIDGE SMART MIX (DISPOSABLE) ×3 IMPLANT
TRAY FOLEY CATH 14FRSI W/METER (CATHETERS) ×3 IMPLANT
WATER STERILE IRR 1500ML POUR (IV SOLUTION) ×3 IMPLANT
WRAP KNEE MAXI GEL POST OP (GAUZE/BANDAGES/DRESSINGS) ×3 IMPLANT
YANKAUER SUCT BULB TIP 10FT TU (MISCELLANEOUS) ×3 IMPLANT

## 2015-02-13 NOTE — Brief Op Note (Signed)
02/13/2015  12:22 PM  PATIENT:  Michelle Allen  81 y.o. female  PRE-OPERATIVE DIAGNOSIS:Primary  OSTEOARTHRITIS OF RIGHT KNEE  POST-OPERATIVE DIAGNOSIS: Primary OSTEOARTHRITIS OF RIGHT KNEE  PROCEDURE:  Procedure(s): RIGHT TOTAL KNEE ARTHROPLASTY (Right)  SURGEON:  Surgeon(s) and Role:    * Jacki Conesonald A Cuong Moorman, MD - Primary  PHYSICIAN ASSISTANT: Dimitri PedAmber Constable PA  ASSISTANTS: Dimitri PedAmber Constable PA  ANESTHESIA:   general  EBL:  Total I/O In: 250 [IV Piggyback:250] Out: -   BLOOD ADMINISTERED:none  DRAINS: (one) Hemovact drain(s) in the Right Knee with  Suction Open   LOCAL MEDICATIONS USED:  MARCAINE20cc of 0.50% pLAIN AND 20CC OF EXPAREL MIXED WITH 20CC OF NORMAL SALINE.  Marland Kitchen.   SPECIMEN:  No Specimen  DISPOSITION OF SPECIMEN:  N/A  COUNTS:  YES  TOURNIQUET:  * Missing tourniquet times found for documented tourniquets in log:  [id number] *  DICTATION: .Other Dictation: Dictation Number [id number]  PLAN OF CARE: Admit to inpatient   PATIENT DISPOSITION:  Stable in OR   Delay start of Pharmacological VTE agent (>24hrs) due to surgical blood loss or risk of bleeding: yes

## 2015-02-13 NOTE — Interval H&P Note (Signed)
History and Physical Interval Note:  02/13/2015 9:47 AM  Michelle Allen  has presented today for surgery, with the diagnosis of OA OF RIGHT KNEE  The various methods of treatment have been discussed with the patient and family. After consideration of risks, benefits and other options for treatment, the patient has consented to  Procedure(s): RIGHT TOTAL KNEE ARTHROPLASTY (Right) as a surgical intervention .  The patient's history has been reviewed, patient examined, no change in status, stable for surgery.  I have reviewed the patient's chart and labs.  Questions were answered to the patient's satisfaction.     [name]

## 2015-02-13 NOTE — Anesthesia Postprocedure Evaluation (Signed)
  Anesthesia Post-op Note  Patient: Michelle Allen  Procedure(s) Performed: Procedure(s) (LRB): RIGHT TOTAL KNEE ARTHROPLASTY (Right)  Patient Location: PACU  Anesthesia Type: General  Level of Consciousness: awake and alert   Airway and Oxygen Therapy: Patient Spontanous Breathing  Post-op Pain: mild  Post-op Assessment: Post-op Vital signs reviewed, Patient's Cardiovascular Status Stable, Respiratory Function Stable, Patent Airway and No signs of Nausea or vomiting  Last Vitals:  Filed Vitals:   02/13/15 1532  BP: 161/64  Pulse: 77  Temp: 36.7 C  Resp: 14    Post-op Vital Signs: stable   Complications: No apparent anesthesia complications

## 2015-02-13 NOTE — Progress Notes (Signed)
Utilization review completed.  

## 2015-02-13 NOTE — Anesthesia Procedure Notes (Addendum)
Procedure Name: Intubation Date/Time: 02/13/2015 10:31 AM Performed by: Early OsmondEARGLE, Caidin Heidenreich E Pre-anesthesia Checklist: Patient identified, Emergency Drugs available, Suction available and Patient being monitored Patient Re-evaluated:Patient Re-evaluated prior to inductionOxygen Delivery Method: Circle System Utilized Preoxygenation: Pre-oxygenation with 100% oxygen Intubation Type: IV induction Ventilation: Mask ventilation without difficulty Laryngoscope Size: Glidescope and 3 Grade View: Grade I Tube type: Oral Tube size: 7.0 mm Number of attempts: 2 Airway Equipment and Method: Stylet and Oral airway Placement Confirmation: ETT inserted through vocal cords under direct vision,  positive ETCO2 and breath sounds checked- equal and bilateral Secured at: 21 cm Tube secured with: Tape Dental Injury: Teeth and Oropharynx as per pre-operative assessment  Comments: Limited view with Miller #3, large epiglottis and anterior airway, unable to pass boughie or ett with view, glidescope used, grade 1 view, ett passed easily. Easy mask ventilation, atraumatic intubation.

## 2015-02-13 NOTE — Transfer of Care (Signed)
Immediate Anesthesia Transfer of Care Note  Patient: Michelle Allen  Procedure(s) Performed: Procedure(s) (LRB): RIGHT TOTAL KNEE ARTHROPLASTY (Right)  Patient Location: PACU  Anesthesia Type: General  Level of Consciousness: sedated, patient cooperative and responds to stimulation  Airway & Oxygen Therapy: Patient Spontanous Breathing and Patient connected to face mask oxgen  Post-op Assessment: Report given to PACU RN and Post -op Vital signs reviewed and stable  Post vital signs: Reviewed and stable  Complications: No apparent anesthesia complications

## 2015-02-13 NOTE — Anesthesia Preprocedure Evaluation (Signed)
Anesthesia Evaluation  Patient identified by MRN, date of birth, ID band Patient awake    Reviewed: Allergy & Precautions, H&P , NPO status , Patient's Chart, lab work & pertinent test results  Airway Mallampati: II  TM Distance: >3 FB Neck ROM: full    Dental  (+) Chipped, Dental Advisory Given   Pulmonary neg pulmonary ROS,  breath sounds clear to auscultation  Pulmonary exam normal       Cardiovascular Exercise Tolerance: Good hypertension, Pt. on medications Rhythm:regular Rate:Normal     Neuro/Psych negative neurological ROS  negative psych ROS   GI/Hepatic negative GI ROS, Neg liver ROS,   Endo/Other  diabetes, Well Controlled, Type 2, Oral Hypoglycemic Agents  Renal/GU negative Renal ROS  negative genitourinary   Musculoskeletal  (+) Arthritis -,   Abdominal   Peds  Hematology negative hematology ROS (+) anemia , hgb 10.8   Anesthesia Other Findings   Reproductive/Obstetrics negative OB ROS                             Anesthesia Physical Anesthesia Plan  ASA: III  Anesthesia Plan: General   Post-op Pain Management:    Induction: Intravenous  Airway Management Planned: Oral ETT  Additional Equipment:   Intra-op Plan:   Post-operative Plan: Extubation in OR  Informed Consent: I have reviewed the patients History and Physical, chart, labs and discussed the procedure including the risks, benefits and alternatives for the proposed anesthesia with the patient or authorized representative who has indicated his/her understanding and acceptance.   Dental Advisory Given  Plan Discussed with: CRNA and Surgeon  Anesthesia Plan Comments:         Anesthesia Quick Evaluation

## 2015-02-14 LAB — BASIC METABOLIC PANEL
ANION GAP: 5 (ref 5–15)
BUN: 26 mg/dL — ABNORMAL HIGH (ref 6–23)
CALCIUM: 9 mg/dL (ref 8.4–10.5)
CO2: 25 mmol/L (ref 19–32)
CREATININE: 1.09 mg/dL (ref 0.50–1.10)
Chloride: 102 mmol/L (ref 96–112)
GFR calc non Af Amer: 47 mL/min — ABNORMAL LOW (ref >90)
GFR, EST AFRICAN AMERICAN: 54 mL/min — AB (ref >90)
GLUCOSE: 204 mg/dL — AB (ref 70–99)
Potassium: 4.6 mmol/L (ref 3.5–5.1)
SODIUM: 132 mmol/L — AB (ref 135–145)

## 2015-02-14 LAB — GLUCOSE, CAPILLARY
GLUCOSE-CAPILLARY: 98 mg/dL (ref 70–99)
Glucose-Capillary: 123 mg/dL — ABNORMAL HIGH (ref 70–99)
Glucose-Capillary: 124 mg/dL — ABNORMAL HIGH (ref 70–99)

## 2015-02-14 LAB — CBC
HEMATOCRIT: 28 % — AB (ref 36.0–46.0)
HEMOGLOBIN: 9.2 g/dL — AB (ref 12.0–15.0)
MCH: 26.7 pg (ref 26.0–34.0)
MCHC: 32.9 g/dL (ref 30.0–36.0)
MCV: 81.4 fL (ref 78.0–100.0)
Platelets: 203 10*3/uL (ref 150–400)
RBC: 3.44 MIL/uL — AB (ref 3.87–5.11)
RDW: 14.8 % (ref 11.5–15.5)
WBC: 11.7 10*3/uL — AB (ref 4.0–10.5)

## 2015-02-14 MED ORDER — METHOCARBAMOL 500 MG PO TABS: 500.0000 mg | ORAL_TABLET | Freq: Four times a day (QID) | ORAL | Status: DC | PRN

## 2015-02-14 MED ORDER — OXYCODONE HCL 5 MG PO TABS
10.0000 mg | ORAL_TABLET | ORAL | Status: DC | PRN
  Administered 2015-02-14 – 2015-02-16 (×5): 10 mg via ORAL
  Filled 2015-02-14 (×6): qty 2

## 2015-02-14 MED ORDER — OXYCODONE-ACETAMINOPHEN 5-325 MG PO TABS: 1.0000 | ORAL_TABLET | ORAL | Status: DC | PRN

## 2015-02-14 MED ORDER — RIVAROXABAN 10 MG PO TABS: 10.0000 mg | ORAL_TABLET | Freq: Every day | ORAL | Status: DC

## 2015-02-14 NOTE — Discharge Instructions (Addendum)
Walk with your walker. Weight bearing as instructed. Do not remove your dressing unless there is excess drainage Shower only, no tub bath. Call if any temperatures greater than 101 or any wound complications: [phone] during the day and ask for Dr. Jeannetta EllisGioffre's nurse, Mackey Birchwoodammy Johnson.  Information on my medicine - XARELTO (Rivaroxaban)  This medication education was reviewed with me or my healthcare representative as part of my discharge preparation.  The pharmacist that spoke with me during my hospital stay was:  Maurice MarchJackson, Rachel E, [facility]  Why was Xarelto prescribed for you? Xarelto was prescribed for you to reduce the risk of blood clots forming after orthopedic surgery. The medical term for these abnormal blood clots is venous thromboembolism (VTE).  What do you need to know about xarelto ? Take your Xarelto ONCE DAILY at the same time every day. You may take it either with or without food.  If you have difficulty swallowing the tablet whole, you may crush it and mix in applesauce just prior to taking your dose.  Take Xarelto exactly as prescribed by your doctor and DO NOT stop taking Xarelto without talking to the doctor who prescribed the medication.  Stopping without other VTE prevention medication to take the place of Xarelto may increase your risk of developing a clot.  After discharge, you should have regular check-up appointments with your healthcare provider that is prescribing your Xarelto.    What do you do if you miss a dose? If you miss a dose, take it as soon as you remember on the same day then continue your regularly scheduled once daily regimen the next day. Do not take two doses of Xarelto on the same day.   Important Safety Information A possible side effect of Xarelto is bleeding. You should call your healthcare provider right away if you experience any of the following: ? Bleeding from an injury or your nose that does not stop. ? Unusual colored urine (red or  dark brown) or unusual colored stools (red or black). ? Unusual bruising for unknown reasons. ? A serious fall or if you hit your head (even if there is no bleeding).  Some medicines may interact with Xarelto and might increase your risk of bleeding while on Xarelto. To help avoid this, consult your healthcare provider or pharmacist prior to using any new prescription or non-prescription medications, including herbals, vitamins, non-steroidal anti-inflammatory drugs (NSAIDs) and supplements.  This website has more information on Xarelto: [email or web address].

## 2015-02-14 NOTE — Evaluation (Signed)
Occupational Therapy Evaluation Patient Details Name: NATALYAH CUMMISKEY MRN: [id number] DOB: 1933-07-09 Today's Date: 02/14/2015    History of Present Illness Pt admitted for R TKA   Clinical Impression   Pt doing well and is very motivated. She performed grooming in standing and then transferred to the chair for LB self care tasks. Planning for SNF at d/c. Will follow to progress ADL independence. Pt needs min cues for safety as she tends to pick up the walker at times.     Follow Up Recommendations  SNF;Supervision/Assistance - 24 hour    Equipment Recommendations   (TBD next venue)    Recommendations for Other Services       Precautions / Restrictions Precautions Precautions: Knee Restrictions Weight Bearing Restrictions: No Other Position/Activity Restrictions: WBAT      Mobility Bed Mobility               General bed mobility comments: pt OOB with PT  Transfers Overall transfer level: Needs assistance Equipment used: Rolling walker (2 wheeled)             General transfer comment: min guard for stand to sit. pt did well with hand placement.     Balance                                            ADL Overall ADL's : Needs assistance/impaired Eating/Feeding: Independent;Sitting   Grooming: Wash/dry hands;Oral care;Min guard;Standing Grooming Details (indicate cue type and reason): pt reaching around for items and min guard for safety. Upper Body Bathing: Set up;Sitting   Lower Body Bathing: Min guard;Sit to/from stand   Upper Body Dressing : Set up;Sitting   Lower Body Dressing: Min guard;Sit to/from stand   Toilet Transfer: Min guard;Ambulation;RW   Toileting- Architect and Hygiene: Min guard;Sit to/from stand         General ADL Comments: Pt up in bathroom with PT when OT arrived. Pt  wanting to brush her teeth and wash her face. Pt reaching for items and attempting to place items on shelf. Assisted pt with  placing items on higher shelf for safety and overall pt min guard assist for safety. She tends to pick up the walker so min cues to make sure to roll the walker. Pt able to reach down and touch R toes for LB dressing well sitting down. Pt reports only soreness and no pain.      Vision     Perception     Praxis      Pertinent Vitals/Pain Pain Assessment: 0-10 Pain Score: 1  Pain Location: R knee Pain Descriptors / Indicators: Sore Pain Intervention(s): Repositioned;Ice applied     Hand Dominance     Extremity/Trunk Assessment Upper Extremity Assessment Upper Extremity Assessment: Overall WFL for tasks assessed           Communication Communication Communication: No difficulties   Cognition Arousal/Alertness: Awake/alert Behavior During Therapy: WFL for tasks assessed/performed Overall Cognitive Status: Within Functional Limits for tasks assessed                     General Comments       Exercises       Shoulder Instructions      Home Living Family/patient expects to be discharged to:: Skilled nursing facility Living Arrangements: Alone  Prior Functioning/Environment Level of Independence: Independent             OT Diagnosis: Generalized weakness   OT Problem List: Decreased strength;Decreased knowledge of use of DME or AE   OT Treatment/Interventions: Self-care/ADL training;Patient/family education;Therapeutic activities;DME and/or AE instruction    OT Goals(Current goals can be found in the care plan section) Acute Rehab OT Goals Patient Stated Goal: wants to return to independence.  OT Goal Formulation: With patient Time For Goal Achievement: 02/21/15 Potential to Achieve Goals: Good  OT Frequency: Min 2X/week   Barriers to D/C:            Co-evaluation              End of Session Equipment Utilized During Treatment: Rolling walker;Gait belt  Activity Tolerance: Patient  tolerated treatment well Patient left: in chair;with call bell/phone within reach   Time: 0943-1008 OT Time Calculation (min): 25 min Charges:  OT General Charges $OT Visit: 1 Procedure OT Evaluation $Initial OT Evaluation Tier I: 1 Procedure OT Treatments $Self Care/Home Management : 8-22 mins G-Codes:    Lennox LaityStone, Klover Priestly Stafford  [phone] 02/14/2015, 10:42 AM

## 2015-02-14 NOTE — Op Note (Signed)
NAMEHALSTON, FAIRCLOUGH NO.:  [id number]  MEDICAL RECORD NO.:  [id number]  LOCATION:  1608                         FACILITY:  [facility]  PHYSICIAN:  Georges Lynch. Zakkiyya Barno, M.D.DATE OF BIRTH:  1933/12/10  DATE OF PROCEDURE:  02/13/2015 DATE OF DISCHARGE:                              OPERATIVE REPORT   SURGEON:  Georges Lynch. Darrelyn Hillock, M.D.  ASSISTANT:  Dimitri Ped, PA-C.  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  OPERATION:  Right total knee arthroplasty utilizing DePuy system.  I cemented all 3 components.  Gentamicin was used in the cement.  The sizes used was a size 3 right femoral component,  posterior cruciate sacrificing tray was a size 3.  The insert was a polyethylene rotating platform insert, 10 mm thickness size tray, patella was a size 35 with 3 pegs.  DESCRIPTION OF PROCEDURE:  Under general anesthesia, routine orthopedic prep and draping of right lower extremity was carried out.  The appropriate time-out was first carried out.  I also marked the appropriate right leg in the holding area.  At this time, the leg was exsanguinated with Esmarch, tourniquet was elevated at 325 mmHg.  With the knee flexed an anterior approach of the knee was carried out.  Two flaps were created.  I then carried out a median parapatellar incision. I then reflected the patella laterally.  I then did an extensive synovectomy.  We did medial and lateral meniscectomies and excised the anterior posterior cruciate ligaments.  The knee was flexed as I mentioned.  At this particular time, it was irrigated, and I then made my initial drill hole in the intercondylar notch of the femur.  A canal finder was then was inserted, and following that we thoroughly irrigated out the femoral canal.  I removed 12 mm thickness off the distal  femur utilizing intramedullary guide.  Following that, we measured the femur to be a size 3.  We then did our anterior-posterior chamfering cuts for a size 3 right femoral component.  At that time, we then went on and then removed 6 mm thickness off the affected lateral side of the tibia in the usual fashion.  We then inserted our spacer blocks and removed the posterior spurs from the condyles.  We then  measured the gap to be a 10 mm thickness with the spacer blocks.  We then cut our keel cut of the proximal tibial metaphysis in usual fashion.  I then went on and did my femoral notch and cut in the usual fashion for a size 3 right femur. Following that the trial components were inserted.  We felt very good with the 10 mm thickness insert.  With the trial components in place, we then did a resurfacing procedure on the patella in the usual fashion for a 35 mm patella.  We then made 3 drill holes in the articular surface of the patella.  We went through a trial with the patellar button, and we had excellent fit.  All trial components were removed.  We thoroughly Waterpik out the  knee, dried the knee out, cemented all 3 components in simultaneously.  The loose pieces of cement were removed, and in fact we went 1 step further and utilized the Waterpik to make sure there were no loose fragments posteriorly.  Once this was done, we then inserted our permanent rotating platform, 10 mm thickness size 3, reduced the knee and had excellent flexion and extension and good lateral medial stability.  We then inserted a Hemovac drain and closed the knee in layers in the usual fashion.  We also utilized Marcaine 0.25% plain 20 mL as a local along with Exparel 20 mL mixed with 20 mL of normal saline.  The  wound was closed with subcuticular suture.  The precautions were taken throughout the entire procedure for MRSA.  She was given vancomycin IV.  She is allergic to penicillin.           ______________________________ Georges Lynchonald A. Darrelyn HillockGioffre, M.D.     RAG/MEDQ  D:  02/13/2015  T:  02/14/2015  Job:  [id number]

## 2015-02-14 NOTE — Evaluation (Addendum)
Physical Therapy Evaluation Patient Details Name: Michelle Allen MRN: [id number] DOB: 05-Dec-1933 Today's Date: 02/14/2015   History of Present Illness  Pt admitted for R TKA  Clinical Impression  . Patient did ambulate  X 150' Patient will benefit from PT to address problems listed in note below.     Follow Up Recommendations SNF;Supervision/Assistance - 24 hour    Equipment Recommendations  Rolling walker with 5" wheels    Recommendations for Other Services       Precautions / Restrictions Precautions Precautions: Knee Required Braces or Orthoses: Knee Immobilizer - Right Knee Immobilizer - Right: Discontinue once straight leg raise with < 10 degree lag Restrictions Weight Bearing Restrictions: No Other Position/Activity Restrictions: WBAT      Mobility  Bed Mobility Overal bed mobility: Needs Assistance Bed Mobility: Supine to Sit     Supine to sit: Min assist     General bed mobility comments: cues for technique and for  support RLE  Transfers Overall transfer level: Needs assistance Equipment used: Rolling walker (2 wheeled) Transfers: Sit to/from Stand Sit to Stand: Min assist         General transfer comment: cues for hand and  leg position  Ambulation/Gait Ambulation/Gait assistance: Min assist Ambulation Distance (Feet): 150 Feet Assistive device: Rolling walker (2 wheeled) Gait Pattern/deviations: Step-to pattern;Antalgic     General Gait Details: cues for sequence and for rolling walker vs picking it up.  Stairs            Wheelchair Mobility    Modified Rankin (Stroke Patients Only)       Balance                                             Pertinent Vitals/Pain Pain Assessment: 0-10 Pain Score: 1  Pain Location: R knee Pain Descriptors / Indicators: Aching;Sore Pain Intervention(s): Ice applied    Home Living Family/patient expects to be discharged to:: Skilled nursing facility Living Arrangements:  Alone                    Prior Function Level of Independence: Independent               Hand Dominance        Extremity/Trunk Assessment   Upper Extremity Assessment: Overall WFL for tasks assessed           Lower Extremity Assessment: RLE deficits/detail RLE Deficits / Details: able to perform a SLR,     Cervical / Trunk Assessment: Normal  Communication   Communication: No difficulties  Cognition Arousal/Alertness: Awake/alert Behavior During Therapy: WFL for tasks assessed/performed Overall Cognitive Status: Within Functional Limits for tasks assessed                      General Comments General comments (skin integrity, edema, etc.): min guard standing to reach for items and brush teeth.    Exercises Total Joint Exercises Ankle Circles/Pumps: AROM;Right;10 reps Quad Sets: AROM;Right;10 reps Heel Slides: AAROM;Right;10 reps Straight Leg Raises: AAROM;Right;10 reps      Assessment/Plan    PT Assessment Patient needs continued PT services  PT Diagnosis Difficulty walking;Acute pain   PT Problem List Decreased strength;Decreased range of motion;Decreased activity tolerance;Decreased mobility;Decreased knowledge of precautions;Decreased safety awareness;Decreased knowledge of use of DME;Pain  PT Treatment Interventions DME instruction;Gait training;Functional mobility training;Therapeutic activities;Therapeutic exercise;Patient/family education  PT Goals (Current goals can be found in the Care Plan section) Acute Rehab PT Goals Patient Stated Goal: wants to walk without pin. PT Goal Formulation: With patient Time For Goal Achievement: 02/14/15 Potential to Achieve Goals: Good    Frequency 7X/week   Barriers to discharge Decreased caregiver support      Co-evaluation               End of Session Equipment Utilized During Treatment: Right knee immobilizer;Gait belt Activity Tolerance: Patient tolerated treatment  well Patient left:  (with OT in BR) Nurse Communication: Mobility status         Time: 0915-0950 PT Time Calculation (min) (ACUTE ONLY): 35 min   Charges:   PT Evaluation $Initial PT Evaluation Tier I: 1 Procedure PT Treatments $Gait Training: 8-22 mins   PT G Codes:        Rada HayHill, Jamisha Hoeschen Elizabeth 02/14/2015, 1:11 PM  Blanchard KelchKaren Rollande Thursby PT [phone]

## 2015-02-14 NOTE — Progress Notes (Signed)
Clinical Social Work Department BRIEF PSYCHOSOCIAL ASSESSMENT 02/14/2015  Patient:  Michelle Allen, Michelle Allen     Account Number:  [id number]     Admit date:  02/13/2015  Clinical Social Worker:  Lacie Scotts  Date/Time:  02/14/2015 08:42 AM  Referred by:  Physician  Date Referred:  02/13/2015 Referred for  SNF Placement   Other Referral:   Interview type:  Patient Other interview type:    PSYCHOSOCIAL DATA Living Status:  FAMILY Admitted from facility:   Level of care:   Primary support name:  Ceirra Belli Primary support relationship to patient:  SIBLING Degree of support available:   supportive    CURRENT CONCERNS Current Concerns  Post-Acute Placement   Other Concerns:    SOCIAL WORK ASSESSMENT / PLAN Pt is an 80 yr old female living at home prior to hospitalization. CSW met with pt / brother to assist with d/c planning. This is a planned admissions. Pt has made prior arrangements to have ST Rehab at [facility] in [place] following hospital d/c. CSW has contacted SNF and d/c plan has been confirmed. CSW will continue to follow to assist with d/c planning.   Assessment/plan status:  Psychosocial Support/Ongoing Assessment of Needs Other assessment/ plan:   Information/referral to community resources:   Insurance coverage for SNF and ambulance transport reviewed.    PATIENT'S/FAMILY'S RESPONSE TO PLAN OF CARE: " I'm glad my surgery is over. I'm going to [facility] for rehab. " Pt is motivated to work with therapy. She is looking forward to having rehab at [facility].   Werner Lean LCSW [phone]

## 2015-02-14 NOTE — Discharge Summary (Addendum)
Physician Discharge Summary   Patient ID: Michelle Allen MRN: [id number] DOB/AGE: 23-May-1933 81 y.o.  Admit date: 02/13/2015 Discharge date: 02/16/2015  Primary Diagnosis: Primary Osteoarthritis, right knee  Admission Diagnoses:  Past Medical History  Diagnosis Date  . Hypertension   . Hyperlipidemia   . Arthritis   . Rash     rt side of neck  . Anemia   . Diabetes mellitus without complication    Discharge Diagnoses:   Active Problems:   History of total knee arthroplasty  Estimated body mass index is 27.61 kg/(m^2) as calculated from the following:   Height as of this encounter: _0  (1.575 m).   Weight as of this encounter: 68.493 kg (151 lb).  Procedure:  Procedure(s) (LRB): RIGHT TOTAL KNEE ARTHROPLASTY (Right)   Consults: None  HPI: Michelle Allen, 81 y.o. female, has a history of pain and functional disability in the right knee due to arthritis and has failed non-surgical conservative treatments for greater than 12 weeks to includeNSAID's and/or analgesics, corticosteriod injections, flexibility and strengthening excercises and activity modification. Onset of symptoms was gradual, starting 5 years ago with gradually worsening course since that time. The patient noted no past surgery on the right knee(s). Patient currently rates pain in the right knee(s) at 8 out of 10 with activity. Patient has night pain, worsening of pain with activity and weight bearing, pain that interferes with activities of daily living, pain with passive range of motion, crepitus and joint swelling. Patient has evidence of periarticular osteophytes and joint space narrowing by imaging studies. There is no active infection.   Laboratory Data: Admission on 02/13/2015  Component Date Value Ref Range Status  . Glucose-Capillary 02/13/2015 84  70 - 99 mg/dL Final  . Comment 1 02/13/2015 Notify RN   Final  . Glucose-Capillary 02/13/2015 183* 70 - 99 mg/dL Final  . WBC 02/14/2015 11.7* 4.0 - 10.5  K/uL Final  . RBC 02/14/2015 3.44* 3.87 - 5.11 MIL/uL Final  . Hemoglobin 02/14/2015 9.2* 12.0 - 15.0 g/dL Final  . HCT 02/14/2015 28.0* 36.0 - 46.0 % Final  . MCV 02/14/2015 81.4  78.0 - 100.0 fL Final  . MCH 02/14/2015 26.7  26.0 - 34.0 pg Final  . MCHC 02/14/2015 32.9  30.0 - 36.0 g/dL Final  . RDW 02/14/2015 14.8  11.5 - 15.5 % Final  . Platelets 02/14/2015 203  150 - 400 K/uL Final  . Sodium 02/14/2015 132* 135 - 145 mmol/L Final  . Potassium 02/14/2015 4.6  3.5 - 5.1 mmol/L Final  . Chloride 02/14/2015 102  96 - 112 mmol/L Final  . CO2 02/14/2015 25  19 - 32 mmol/L Final  . Glucose, Bld 02/14/2015 204* 70 - 99 mg/dL Final  . BUN 02/14/2015 26* 6 - 23 mg/dL Final  . Creatinine, Ser 02/14/2015 1.09  0.50 - 1.10 mg/dL Final  . Calcium 02/14/2015 9.0  8.4 - 10.5 mg/dL Final  . GFR calc non Af Amer 02/14/2015 47* >90 mL/min Final  . GFR calc Af Amer 02/14/2015 54* >90 mL/min Final   Comment: (NOTE) The eGFR has been calculated using the CKD EPI equation. This calculation has not been validated in all clinical situations. eGFR's persistently <90 mL/min signify possible Chronic Kidney Disease.   . Anion gap 02/14/2015 5  5 - 15 Final  . Glucose-Capillary 02/13/2015 168* 70 - 99 mg/dL Final  . Glucose-Capillary 02/13/2015 200* 70 - 99 mg/dL Final  . Comment 1 02/13/2015 Notify RN   Final  .  Comment 2 02/13/2015 Document in Chart   Final  . Glucose-Capillary 02/14/2015 123* 70 - 99 mg/dL Final  . Glucose-Capillary 02/14/2015 124* 70 - 99 mg/dL Final  . Glucose-Capillary 02/14/2015 98  70 - 99 mg/dL Final  Hospital Outpatient Visit on 02/09/2015  Component Date Value Ref Range Status  . ABO/RH(D) 02/09/2015 B POS   Final  Hospital Outpatient Visit on 02/09/2015  Component Date Value Ref Range Status  . MRSA, PCR 02/09/2015 POSITIVE* NEGATIVE Final   Comment: RESULT CALLED TO, READ BACK BY AND VERIFIED WITH: AFTER HOURS @ 1716 ON [id number] BY POTEAT,S   . Staphylococcus aureus  02/09/2015 POSITIVE* NEGATIVE Final   Comment:        The Xpert SA Assay (FDA approved for NASAL specimens in patients over 31 years of age), is one component of a comprehensive surveillance program.  Test performance has been validated by [facility] for patients greater than or equal to 11 year old. It is not intended to diagnose infection nor to guide or monitor treatment.   Marland Kitchen aPTT 02/09/2015 27  24 - 37 seconds Final  . WBC 02/09/2015 5.9  4.0 - 10.5 K/uL Final  . RBC 02/09/2015 4.10  3.87 - 5.11 MIL/uL Final  . Hemoglobin 02/09/2015 10.8* 12.0 - 15.0 g/dL Final  . HCT 02/09/2015 33.6* 36.0 - 46.0 % Final  . MCV 02/09/2015 82.0  78.0 - 100.0 fL Final  . MCH 02/09/2015 26.3  26.0 - 34.0 pg Final  . MCHC 02/09/2015 32.1  30.0 - 36.0 g/dL Final  . RDW 02/09/2015 14.8  11.5 - 15.5 % Final  . Platelets 02/09/2015 252  150 - 400 K/uL Final  . Neutrophils Relative % 02/09/2015 54  43 - 77 % Final  . Neutro Abs 02/09/2015 3.2  1.7 - 7.7 K/uL Final  . Lymphocytes Relative 02/09/2015 33  12 - 46 % Final  . Lymphs Abs 02/09/2015 2.0  0.7 - 4.0 K/uL Final  . Monocytes Relative 02/09/2015 11  3 - 12 % Final  . Monocytes Absolute 02/09/2015 0.7  0.1 - 1.0 K/uL Final  . Eosinophils Relative 02/09/2015 2  0 - 5 % Final  . Eosinophils Absolute 02/09/2015 0.1  0.0 - 0.7 K/uL Final  . Basophils Relative 02/09/2015 0  0 - 1 % Final  . Basophils Absolute 02/09/2015 0.0  0.0 - 0.1 K/uL Final  . Sodium 02/09/2015 137  135 - 145 mmol/L Final  . Potassium 02/09/2015 3.8  3.5 - 5.1 mmol/L Final  . Chloride 02/09/2015 106  96 - 112 mmol/L Final  . CO2 02/09/2015 25  19 - 32 mmol/L Final  . Glucose, Bld 02/09/2015 85  70 - 99 mg/dL Final  . BUN 02/09/2015 26* 6 - 23 mg/dL Final  . Creatinine, Ser 02/09/2015 1.20* 0.50 - 1.10 mg/dL Final  . Calcium 02/09/2015 9.0  8.4 - 10.5 mg/dL Final  . Total Protein 02/09/2015 7.8  6.0 - 8.3 g/dL Final  . Albumin 02/09/2015 4.0  3.5 - 5.2 g/dL Final  . AST  02/09/2015 25  0 - 37 U/L Final  . ALT 02/09/2015 16  0 - 35 U/L Final  . Alkaline Phosphatase 02/09/2015 94  39 - 117 U/L Final  . Total Bilirubin 02/09/2015 0.5  0.3 - 1.2 mg/dL Final  . GFR calc non Af Amer 02/09/2015 42* >90 mL/min Final  . GFR calc Af Amer 02/09/2015 48* >90 mL/min Final   Comment: (NOTE) The eGFR has been calculated using  the CKD EPI equation. This calculation has not been validated in all clinical situations. eGFR's persistently <90 mL/min signify possible Chronic Kidney Disease.   . Anion gap 02/09/2015 6  5 - 15 Final  . Prothrombin Time 02/09/2015 13.7  11.6 - 15.2 seconds Final  . INR 02/09/2015 1.04  0.00 - 1.49 Final  . Color, Urine 02/09/2015 YELLOW  YELLOW Final  . APPearance 02/09/2015 CLEAR  CLEAR Final  . Specific Gravity, Urine 02/09/2015 1.018  1.005 - 1.030 Final  . pH 02/09/2015 7.0  5.0 - 8.0 Final  . Glucose, UA 02/09/2015 NEGATIVE  NEGATIVE mg/dL Final  . Hgb urine dipstick 02/09/2015 NEGATIVE  NEGATIVE Final  . Bilirubin Urine 02/09/2015 NEGATIVE  NEGATIVE Final  . Ketones, ur 02/09/2015 NEGATIVE  NEGATIVE mg/dL Final  . Protein, ur 02/09/2015 NEGATIVE  NEGATIVE mg/dL Final  . Urobilinogen, UA 02/09/2015 1.0  0.0 - 1.0 mg/dL Final  . Nitrite 02/09/2015 NEGATIVE  NEGATIVE Final  . Leukocytes, UA 02/09/2015 NEGATIVE  NEGATIVE Final   MICROSCOPIC NOT DONE ON URINES WITH NEGATIVE PROTEIN, BLOOD, LEUKOCYTES, NITRITE, OR GLUCOSE <1000 mg/dL.  . ABO/RH(D) 02/09/2015 B POS   Final  . Antibody Screen 02/09/2015 NEG   Final  . Sample Expiration 02/09/2015 02/16/2015   Final     X-Rays:Dg Chest 2 View  02/09/2015   CLINICAL DATA:  Preoperative exam prior total knee joint replacement, history of hypertension, nonsmoker.  EXAM: CHEST  2 VIEW  COMPARISON:  Portable chest x-ray dated Apr 24, 2010  FINDINGS: The lungs are adequately inflated and clear. There is no pleural effusion or pneumothorax. The heart and pulmonary vascularity are normal. There is  tortuosity of the ascending and descending thoracic aorta. There is multilevel degenerative disc disease of the thoracic spine.  IMPRESSION: There is no active cardiopulmonary disease. There is atherosclerotic tortuosity of the ascending and descending thoracic aorta.   Electronically Signed   By: David  Martinique   On: 02/09/2015 16:48    Hospital Course: Michelle Allen is a 81 y.o. who was admitted to [facility]. They were brought to the operating room on 02/13/2015 and underwent Procedure(s): RIGHT TOTAL KNEE ARTHROPLASTY.  Patient tolerated the procedure well and was later transferred to the recovery room and then to the orthopaedic floor for postoperative care.  They were given PO and IV analgesics for pain control following their surgery.  They were given 24 hours of postoperative antibiotics of  Anti-infectives    Start     Dose/Rate Route Frequency Ordered Stop   02/13/15 2200  vancomycin (VANCOCIN) IVPB 1000 mg/200 mL premix     1,000 mg 200 mL/hr over 60 Minutes Intravenous Every 12 hours 02/13/15 1452 02/14/15 0022   02/13/15 1133  polymyxin B 500,000 Units, bacitracin 50,000 Units in sodium chloride irrigation 0.9 % 500 mL irrigation  Status:  Discontinued       As needed 02/13/15 1134 02/13/15 1301   02/13/15 1015  vancomycin (VANCOCIN) 1,000 mg in sodium chloride 0.9 % 250 mL IVPB     1,000 mg 250 mL/hr over 60 Minutes Intravenous  Once 02/13/15 1009 02/13/15 1109   02/13/15 0823  ceFAZolin (ANCEF) IVPB 2 g/50 mL premix  Status:  Discontinued     2 g 100 mL/hr over 30 Minutes Intravenous On call to O.R. 02/13/15 5638 02/13/15 1422     and started on DVT prophylaxis in the form of Xarelto.   PT and OT were ordered for total joint protocol.  Discharge planning consulted to help with postop disposition and equipment needs.  Patient had a fair night on the evening of surgery.  They started to get up OOB with therapy on day one. Hemovac drain was pulled without difficulty. Patient  progressed slowly with therapy post op day two. Patient was feeling better post op day three and DC to SNF was prepared.    Diet: Cardiac diet Activity:WBAT Follow-up:in 2 weeks Disposition - Skilled nursing facility Discharged Condition: stable   Discharge Instructions    Call MD / Call 911    Complete by:  As directed   If you experience chest pain or shortness of breath, CALL 911 and be transported to the hospital emergency room.  If you develope a fever above 101 F, pus (white drainage) or increased drainage or redness at the wound, or calf pain, call your surgeon's office.     Constipation Prevention    Complete by:  As directed   Drink plenty of fluids.  Prune juice may be helpful.  You may use a stool softener, such as Colace (over the counter) 100 mg twice a day.  Use MiraLax (over the counter) for constipation as needed.     Diet - low sodium heart healthy    Complete by:  As directed      Diet general    Complete by:  As directed      Driving restrictions    Complete by:  As directed   No driving for     Increase activity slowly as tolerated    Complete by:  As directed             Medication List    STOP taking these medications        OMEGA 3 PO      TAKE these medications        ferrous fumarate 325 (106 FE) MG Tabs tablet  Commonly known as:  HEMOCYTE - 106 mg FE  Take 1 tablet by mouth daily.     glimepiride 4 MG tablet  Commonly known as:  AMARYL  Take 4 mg by mouth daily with breakfast.     losartan-hydrochlorothiazide 100-25 MG per tablet  Commonly known as:  HYZAAR  Take 1 tablet by mouth every morning.     meclizine 25 MG tablet  Commonly known as:  ANTIVERT  Take 25 mg by mouth 3 (three) times daily as needed for dizziness.     methocarbamol 500 MG tablet  Commonly known as:  ROBAXIN  Take 1 tablet (500 mg total) by mouth every 6 (six) hours as needed for muscle spasms.     oxyCODONE-acetaminophen 5-325 MG per tablet  Commonly known as:   PERCOCET/ROXICET  Take 1-2 tablets by mouth every 4 (four) hours as needed for moderate pain.     rivaroxaban 10 MG Tabs tablet  Commonly known as:  XARELTO  Take 1 tablet (10 mg total) by mouth daily with breakfast.     traMADol 50 MG tablet  Commonly known as:  ULTRAM  Take 50 mg by mouth every 6 (six) hours as needed for moderate pain.           Follow-up Information    Follow up with GIOFFRE,RONALD A, MD. Schedule an appointment as soon as possible for a visit in 2 weeks.   Specialty:  Orthopedic Surgery   Contact information:   [address] [place] [address] [phone]       Signed:  Ardeen Jourdain, PA-C Orthopaedic Surgery 02/16/2015, 9:04AM

## 2015-02-14 NOTE — Progress Notes (Signed)
Pt has a SNF bed at [facility] in [place] on SUN if she is ready for d/c. Weekend CSW will assist with d/c planning to SNF.  Cori RazorJamie Imya Mance LCSW [phone]

## 2015-02-14 NOTE — Progress Notes (Signed)
Physical Therapy Treatment Patient Details Name: Michelle KiefKatie L Allen MRN: [id number] DOB: 11/16/1933 Today's Date: 02/14/2015    History of Present Illness Pt admitted for R TKA    PT Comments    Patient just back from Bathroom. Assisted with there ex R knee  Follow Up Recommendations  SNF;Supervision/Assistance - 24 hour     Equipment Recommendations  Rolling walker with 5" wheels    Recommendations for Other Services       Precautions / Restrictions Precautions Precautions: Knee Required Braces or Orthoses: Knee Immobilizer - Right Knee Immobilizer - Right: Discontinue once straight leg raise with < 10 degree lag Restrictions Other Position/Activity Restrictions: WBAT    Mobility              Stairs            Wheelchair Mobility    Modified Rankin (Stroke Patients Only)       Balance                                    Cognition Arousal/Alertness: Awake/alert Behavior During Therapy: WFL for tasks assessed/performed Overall Cognitive Status: Within Functional Limits for tasks assessed                      Exercises Total Joint Exercises Ankle Circles/Pumps: AROM;Right;10 reps Quad Sets: AROM;Right;10 reps Short Arc QuadBarbaraann Boys: AAROM;Right;10 reps Heel Slides: AAROM;Right;10 reps Straight Leg Raises: AAROM;Right;10 reps Goniometric ROM: 10-60 R knee    General Comments        Pertinent Vitals/Pain Pain Score: 1  Pain Location: R knee Pain Descriptors / Indicators: Aching;Sore Pain Intervention(s): Ice applied    Home Living Family/patient expects to be discharged to:: Skilled nursing facility Living Arrangements: Alone                  Prior Function Level of Independence: Independent          PT Goals (current goals can now be found in the care plan section) Acute Rehab PT Goals Patient Stated Goal: wants to walk without pin. PT Goal Formulation: With patient Time For Goal Achievement: 02/14/15 Potential  to Achieve Goals: Good Progress towards PT goals: Progressing toward goals    Frequency  7X/week    PT Plan Current plan remains appropriate    Co-evaluation             End of Session Equipment Utilized During Treatment: Right knee immobilizer;Gait belt Activity Tolerance: Patient tolerated treatment well Patient left: in bed;with call bell/phone within reach;with family/visitor present     Time: [id number] PT Time Calculation (min) (ACUTE ONLY): 11 min  Charges:  $Gait Training: 8-22 mins $Therapeutic Exercise: 8-22 mins                    G Codes:      Rada HayHill, Kayshawn Ozburn Elizabeth 02/14/2015, 5:00 PM

## 2015-02-14 NOTE — Progress Notes (Signed)
Clinical Social Work Department CLINICAL SOCIAL WORK PLACEMENT NOTE 02/14/2015  Patient:  Michelle Allen,Michelle Allen  Account Number:  [id number] Admit date:  02/13/2015  Clinical Social Worker:  Cori RazorJAMIE Aydee Mcnew, LCSW  Date/time:  02/14/2015 09:04 AM  Clinical Social Work is seeking post-discharge placement for this patient at the following level of care:   SKILLED NURSING   (*CSW will update this form in Epic as items are completed)     Patient/family provided with [facility] Department of Clinical Social Work's list of facilities offering this level of care within the geographic area requested by the patient (or if unable, by the patient's family).  02/14/2015  Patient/family informed of their freedom to choose among providers that offer the needed level of care, that participate in Medicare, Medicaid or managed care program needed by the patient, have an available bed and are willing to accept the patient.    Patient/family informed of [facility]' ownership interest in [facility], as well as of the fact that they are under no obligation to receive care at this facility.  PASARR submitted to EDS on 02/13/2015 PASARR number received on 02/13/2015  FL2 transmitted to all facilities in geographic area requested by pt/family on  02/13/2015 FL2 transmitted to all facilities within larger geographic area on   Patient informed that his/her managed care company has contracts with or will negotiate with  certain facilities, including the following:     Patient/family informed of bed offers received:  02/13/2015 [facility] [facility] [place] Physician recommends and patient chooses bed at    Patient to be transferred to  on   Patient to be transferred to facility by  Patient and family notified of transfer on  Name of family member notified:    The following physician request were entered in Epic:   Additional Comments:  Cori RazorJamie Raed Schalk LCSW [phone]

## 2015-02-14 NOTE — Progress Notes (Signed)
Subjective: 1 Day Post-Op Procedure(s) (LRB): RIGHT TOTAL KNEE ARTHROPLASTY (Right) Patient reports pain as 2 on 0-10 scale. Hemovac DCd. HBg 9.1Plan on SNF when Stable and HBg is Stable. NA 132  Objective: Vital signs in last 24 hours: Temp:  [97.8 F (36.6 C)-99 F (37.2 C)] 99 F (37.2 C) (02/27 0520) Pulse Rate:  [60-104] 60 (02/27 0520) Resp:  [11-18] 18 (02/27 0520) BP: (125-172)/(51-72) 129/51 mmHg (02/27 0520) SpO2:  [100 %] 100 % (02/27 0520) Weight:  [68.493 kg (151 lb)] 68.493 kg (151 lb) (02/26 1510)  Intake/Output from previous day: 02/26 0701 - 02/27 0700 In: 2051.7 [P.O.:240; I.V.:1401.7; IV Piggyback:410] Out: 2130 [Urine:2100; Drains:30] Intake/Output this shift:     Recent Labs  02/14/15 0425  HGB 9.2*    Recent Labs  02/14/15 0425  WBC 11.7*  RBC 3.44*  HCT 28.0*  PLT 203    Recent Labs  02/14/15 0425  NA 132*  K 4.6  CL 102  CO2 25  BUN 26*  CREATININE 1.09  GLUCOSE 204*  CALCIUM 9.0   No results for input(s): LABPT, INR in the last 72 hours.  Dorsiflexion/Plantar flexion intact No cellulitis present  Assessment/Plan: 1 Day Post-Op Procedure(s) (LRB): RIGHT TOTAL KNEE ARTHROPLASTY (Right) Up with therapy skilled nursing facility on Sunday or Monday.,depends on her stability and availability of SNF  Corazon Nickolas A 02/14/2015, 7:24 AM

## 2015-02-15 LAB — BASIC METABOLIC PANEL
ANION GAP: 5 (ref 5–15)
BUN: 31 mg/dL — ABNORMAL HIGH (ref 6–23)
CO2: 26 mmol/L (ref 19–32)
CREATININE: 1.14 mg/dL — AB (ref 0.50–1.10)
Calcium: 8.7 mg/dL (ref 8.4–10.5)
Chloride: 106 mmol/L (ref 96–112)
GFR calc non Af Amer: 44 mL/min — ABNORMAL LOW (ref >90)
GFR, EST AFRICAN AMERICAN: 51 mL/min — AB (ref >90)
Glucose, Bld: 122 mg/dL — ABNORMAL HIGH (ref 70–99)
Potassium: 4.3 mmol/L (ref 3.5–5.1)
Sodium: 137 mmol/L (ref 135–145)

## 2015-02-15 LAB — GLUCOSE, CAPILLARY
GLUCOSE-CAPILLARY: 89 mg/dL (ref 70–99)
GLUCOSE-CAPILLARY: 124 mg/dL — AB (ref 70–99)
Glucose-Capillary: 109 mg/dL — ABNORMAL HIGH (ref 70–99)

## 2015-02-15 LAB — CBC
HCT: 26.3 % — ABNORMAL LOW (ref 36.0–46.0)
HEMOGLOBIN: 8.4 g/dL — AB (ref 12.0–15.0)
MCH: 26.5 pg (ref 26.0–34.0)
MCHC: 31.9 g/dL (ref 30.0–36.0)
MCV: 83 fL (ref 78.0–100.0)
Platelets: 173 10*3/uL (ref 150–400)
RBC: 3.17 MIL/uL — AB (ref 3.87–5.11)
RDW: 15.4 % (ref 11.5–15.5)
WBC: 9 10*3/uL (ref 4.0–10.5)

## 2015-02-15 NOTE — Clinical Social Work Note (Addendum)
CSW called and spoke with pt's RN to clarify if pt was ready for discharge today  RN stated that Dr. Rennis ChrisSupple  Is covering for Dr Darrelyn HillockGioffre today and informed her that pt is not ready for discharge today  CSW will continue to follow pt for needs until discharge.  Michelle Buba.Kurtiss Wence, LCSW [facility] Clinical Social Worker - Weekend Coverage cell #: [phone]

## 2015-02-15 NOTE — Progress Notes (Signed)
Physical Therapy Treatment Patient Details Name: Michelle Allen MRN: [id number] DOB: 03/21/1933 Today's Date: 02/15/2015    History of Present Illness Pt admitted for R TKA    PT Comments    Patient reports frequent urgency for urinating. Patient progressing well. Plans SNF tomorrow.  Follow Up Recommendations  SNF;Supervision/Assistance - 24 hour     Equipment Recommendations       Recommendations for Other Services       Precautions / Restrictions Precautions Precautions: Knee Required Braces or Orthoses: Knee Immobilizer - Right Knee Immobilizer - Right: Discontinue once straight leg raise with < 10 degree lag    Mobility  Bed Mobility   Bed Mobility: Supine to Sit     Supine to sit: Supervision     General bed mobility comments: patient asking to perform without assist, and successful.  Transfers Overall transfer level: Needs assistance Equipment used: Rolling walker (2 wheeled) Transfers: Sit to/from Stand Sit to Stand: Min guard         General transfer comment: cues for hand and  leg position  Ambulation/Gait Ambulation/Gait assistance: Min guard Ambulation Distance (Feet): 150 Feet Assistive device: Rolling walker (2 wheeled) Gait Pattern/deviations: Step-to pattern;Step-through pattern;Antalgic     General Gait Details: cues for sequence and for rolling walker vs picking it up.   Stairs            Wheelchair Mobility    Modified Rankin (Stroke Patients Only)       Balance                                    Cognition Arousal/Alertness: Awake/alert                          Exercises Total Joint Exercises Ankle Circles/Pumps: AROM;Right;10 reps Quad Sets: AROM;10 reps;Both Towel Squeeze: AROM;Both;10 reps;Supine Short Arc Quad: AAROM;Right;10 reps;Supine Heel Slides: AAROM;Right;10 reps;Supine Hip ABduction/ADduction: AROM;Right;10 reps;Supine Straight Leg Raises: AAROM;Right;10  reps;Supine Goniometric ROM: 10-65 R knee    General Comments        Pertinent Vitals/Pain Pain Score: 4  Pain Location: R knee with flexion and extension Pain Descriptors / Indicators: Discomfort;Tightness Pain Intervention(s): Limited activity within patient's tolerance;Premedicated before session;Repositioned;Ice applied    Home Living                      Prior Function            PT Goals (current goals can now be found in the care plan section) Progress towards PT goals: Progressing toward goals    Frequency  7X/week    PT Plan Current plan remains appropriate    Co-evaluation             End of Session Equipment Utilized During Treatment: Gait belt Activity Tolerance: Patient tolerated treatment well Patient left: in chair;with call bell/phone within reach     Time: 1039-1110 PT Time Calculation (min) (ACUTE ONLY): 31 min  Charges:  $Gait Training: 8-22 mins $Therapeutic Exercise: 8-22 mins                    G Codes:      Michelle Allen, Michelle Allen 02/15/2015, 11:28 AM

## 2015-02-15 NOTE — Progress Notes (Signed)
Michelle KiefKatie L Allen  MRN: [id number] DOB/Age: 79/04/1933 79 y.o. Physician: Lynnea MaizesK Arnelle Nale, M.D. 2 Days Post-Op Procedure(s) (LRB): RIGHT TOTAL KNEE ARTHROPLASTY (Right)  Subjective: Resting comfortably, fair appetite, mobilized to bathroom Vital Signs Temp:  [98.5 F (36.9 C)-99.8 F (37.7 C)] 99 F (37.2 C) (02/28 0551) Pulse Rate:  [61-72] 61 (02/28 0551) Resp:  [18-20] 18 (02/28 0551) BP: (134-141)/(48-66) 139/66 mmHg (02/28 0551) SpO2:  [98 %-100 %] 100 % (02/28 0551)  Lab Results  Recent Labs  02/14/15 0425 02/15/15 0509  WBC 11.7* 9.0  HGB 9.2* 8.4*  HCT 28.0* 26.3*  PLT 203 173   BMET  Recent Labs  02/14/15 0425 02/15/15 0509  NA 132* 137  K 4.6 4.3  CL 102 106  CO2 25 26  GLUCOSE 204* 122*  BUN 26* 31*  CREATININE 1.09 1.14*  CALCIUM 9.0 8.7   INR  Date Value Ref Range Status  02/09/2015 1.04 0.00 - 1.49 Final     Exam  Nursing staff reports stable vitals, steady clinical progress, knee ROM flexion to 60 with PT.  Plan Mobilize with PT, patient reports concerns regarding progress, and discussed plan for additional PT today and  d/c to SNF tomorrow.  Danta Baumgardner M 02/15/2015, 8:53 AM

## 2015-02-16 ENCOUNTER — Encounter (HOSPITAL_COMMUNITY): Payer: Self-pay | Admitting: Orthopedic Surgery

## 2015-02-16 DIAGNOSIS — Z96651 Presence of right artificial knee joint: Secondary | ICD-10-CM | POA: Diagnosis not present

## 2015-02-16 DIAGNOSIS — I10 Essential (primary) hypertension: Secondary | ICD-10-CM | POA: Diagnosis not present

## 2015-02-16 DIAGNOSIS — E119 Type 2 diabetes mellitus without complications: Secondary | ICD-10-CM | POA: Diagnosis not present

## 2015-02-16 DIAGNOSIS — G8918 Other acute postprocedural pain: Secondary | ICD-10-CM | POA: Diagnosis not present

## 2015-02-16 DIAGNOSIS — Z471 Aftercare following joint replacement surgery: Secondary | ICD-10-CM | POA: Diagnosis not present

## 2015-02-16 DIAGNOSIS — I119 Hypertensive heart disease without heart failure: Secondary | ICD-10-CM | POA: Diagnosis not present

## 2015-02-16 DIAGNOSIS — M1990 Unspecified osteoarthritis, unspecified site: Secondary | ICD-10-CM | POA: Diagnosis not present

## 2015-02-16 DIAGNOSIS — E785 Hyperlipidemia, unspecified: Secondary | ICD-10-CM | POA: Diagnosis not present

## 2015-02-16 DIAGNOSIS — D649 Anemia, unspecified: Secondary | ICD-10-CM | POA: Diagnosis not present

## 2015-02-16 DIAGNOSIS — R21 Rash and other nonspecific skin eruption: Secondary | ICD-10-CM | POA: Diagnosis not present

## 2015-02-16 LAB — GLUCOSE, CAPILLARY
GLUCOSE-CAPILLARY: 99 mg/dL (ref 70–99)
Glucose-Capillary: 102 mg/dL — ABNORMAL HIGH (ref 70–99)

## 2015-02-16 LAB — CBC
HEMATOCRIT: 27.9 % — AB (ref 36.0–46.0)
Hemoglobin: 9 g/dL — ABNORMAL LOW (ref 12.0–15.0)
MCH: 26.5 pg (ref 26.0–34.0)
MCHC: 32.3 g/dL (ref 30.0–36.0)
MCV: 82.1 fL (ref 78.0–100.0)
PLATELETS: 182 10*3/uL (ref 150–400)
RBC: 3.4 MIL/uL — ABNORMAL LOW (ref 3.87–5.11)
RDW: 15.5 % (ref 11.5–15.5)
WBC: 7.4 10*3/uL (ref 4.0–10.5)

## 2015-02-16 MED ORDER — OXYCODONE HCL 10 MG PO TABS
5.0000 mg | ORAL_TABLET | ORAL | Status: DC | PRN
Start: 2015-02-16 — End: 2024-03-01

## 2015-02-16 NOTE — Progress Notes (Signed)
   Subjective: 3 Days Post-Op Procedure(s) (LRB): RIGHT TOTAL KNEE ARTHROPLASTY (Right) Patient reports pain as mild.   Patient is well, and has had no acute complaints or problems. She reports that she is feeling well this morning. No issues overnight. No SOB or chest pain. Feels that she is progressing with PT. Plan is to go Skilled nursing facility after hospital stay.  Objective: Vital signs in last 24 hours: Temp:  [98.1 F (36.7 C)-98.7 F (37.1 C)] 98.1 F (36.7 C) (02/29 0547) Pulse Rate:  [58-103] 103 (02/29 0547) Resp:  [18-20] 20 (02/29 0547) BP: (124-152)/(58-73) 124/73 mmHg (02/29 0547) SpO2:  [98 %-100 %] 98 % (02/29 0547)  Intake/Output from previous day:  Intake/Output Summary (Last 24 hours) at 02/16/15 0901 Last data filed at 02/16/15 0845  Gross per 24 hour  Intake    360 ml  Output   1625 ml  Net  -1265 ml    Intake/Output this shift: Total I/O In: -  Out: 150 [Urine:150]  Labs:  Recent Labs  02/14/15 0425 02/15/15 0509 02/16/15 0522  HGB 9.2* 8.4* 9.0*    Recent Labs  02/15/15 0509 02/16/15 0522  WBC 9.0 7.4  RBC 3.17* 3.40*  HCT 26.3* 27.9*  PLT 173 182    Recent Labs  02/14/15 0425 02/15/15 0509  NA 132* 137  K 4.6 4.3  CL 102 106  CO2 25 26  BUN 26* 31*  CREATININE 1.09 1.14*  GLUCOSE 204* 122*  CALCIUM 9.0 8.7    EXAM General - Patient is Alert and Oriented Extremity - Neurologically intact Intact pulses distally Dorsiflexion/Plantar flexion intact Compartment soft Dressing/Incision - clean, dry, no drainage Motor Function - intact, moving foot and toes well on exam.   Past Medical History  Diagnosis Date  . Hypertension   . Hyperlipidemia   . Arthritis   . Rash     rt side of neck  . Anemia   . Diabetes mellitus without complication     Assessment/Plan: 3 Days Post-Op Procedure(s) (LRB): RIGHT TOTAL KNEE ARTHROPLASTY (Right) Active Problems:   History of total knee arthroplasty  Estimated body mass  index is 27.61 kg/(m^2) as calculated from the following:   Height as of this encounter: 5\' 2"  (1.575 m).   Weight as of this encounter: 68.493 kg (151 lb). Advance diet Up with therapy Discharge to SNF  DVT Prophylaxis - Xarelto Weight-Bearing as tolerated  Continue PT today. Plan for DC to SNF [facility] in [place] today. Follow up instructions discussed. Will see in office 2 weeks from surgery date.   Dimitri PedAmber Wilman Tucker, PA-C Orthopaedic Surgery 02/16/2015, 9:01 AM

## 2015-02-16 NOTE — Progress Notes (Signed)
Clinical Social Work Department CLINICAL SOCIAL WORK PLACEMENT NOTE 02/16/2015  Patient:  Michelle Allen,Michelle Allen  Account Number:  [id number] Admit date:  02/13/2015  Clinical Social Worker:  Cori RazorJAMIE Aniruddh Ciavarella, LCSW  Date/time:  02/14/2015 09:04 AM  Clinical Social Work is seeking post-discharge placement for this patient at the following level of care:   SKILLED NURSING   (*CSW will update this form in Epic as items are completed)     Patient/family provided with [facility] Department of Clinical Social Work's list of facilities offering this level of care within the geographic area requested by the patient (or if unable, by the patient's family).  02/14/2015  Patient/family informed of their freedom to choose among providers that offer the needed level of care, that participate in Medicare, Medicaid or managed care program needed by the patient, have an available bed and are willing to accept the patient.    Patient/family informed of [facility]' ownership interest in [facility], as well as of the fact that they are under no obligation to receive care at this facility.  PASARR submitted to EDS on 02/13/2015 PASARR number received on 02/13/2015  FL2 transmitted to all facilities in geographic area requested by pt/family on  02/13/2015 FL2 transmitted to all facilities within larger geographic area on   Patient informed that his/her managed care company has contracts with or will negotiate with  certain facilities, including the following:     Patient/family informed of bed offers received:  02/13/2015 [facility] Physician recommends and patient chooses bed at    Patient to be transferred to [facility] [place] on  02/16/2015 Patient to be transferred to facility by CAR Patient and family notified of transfer on 02/16/2015 Name of family member notified:  BROTHER  The following physician request were entered in Epic:   Additional Comments: Pt  /  brother are in agreemnet with d/c to SNF today. PT approved transport by car. NSG reviewed d/c summary, scripts, avs. Scripts included in d/c packet. D/C packet provided to pt prior to d/c.  Cori RazorJamie Chanel Mcadams LCSW [phone]

## 2015-02-16 NOTE — Progress Notes (Signed)
RN called report to RN at facility. All questions answered. Patient and family given paperwork for facility before discharge.  NT rolled patient down in car to family vehicle.

## 2015-02-17 DIAGNOSIS — I119 Hypertensive heart disease without heart failure: Secondary | ICD-10-CM | POA: Diagnosis not present

## 2015-02-17 DIAGNOSIS — G8918 Other acute postprocedural pain: Secondary | ICD-10-CM | POA: Diagnosis not present

## 2015-02-17 DIAGNOSIS — Z471 Aftercare following joint replacement surgery: Secondary | ICD-10-CM | POA: Diagnosis not present

## 2015-02-17 DIAGNOSIS — D649 Anemia, unspecified: Secondary | ICD-10-CM | POA: Diagnosis not present

## 2015-03-04 DIAGNOSIS — Z96651 Presence of right artificial knee joint: Secondary | ICD-10-CM | POA: Diagnosis not present

## 2015-03-04 DIAGNOSIS — I10 Essential (primary) hypertension: Secondary | ICD-10-CM | POA: Diagnosis not present

## 2015-03-04 DIAGNOSIS — M1990 Unspecified osteoarthritis, unspecified site: Secondary | ICD-10-CM | POA: Diagnosis not present

## 2015-03-04 DIAGNOSIS — Z471 Aftercare following joint replacement surgery: Secondary | ICD-10-CM | POA: Diagnosis not present

## 2015-03-05 DIAGNOSIS — M25561 Pain in right knee: Secondary | ICD-10-CM | POA: Diagnosis not present

## 2015-03-10 DIAGNOSIS — M25561 Pain in right knee: Secondary | ICD-10-CM | POA: Diagnosis not present

## 2015-03-17 DIAGNOSIS — M25561 Pain in right knee: Secondary | ICD-10-CM | POA: Diagnosis not present

## 2015-03-20 DIAGNOSIS — M25561 Pain in right knee: Secondary | ICD-10-CM | POA: Diagnosis not present

## 2015-03-23 DIAGNOSIS — M25561 Pain in right knee: Secondary | ICD-10-CM | POA: Diagnosis not present

## 2015-03-27 DIAGNOSIS — M25561 Pain in right knee: Secondary | ICD-10-CM | POA: Diagnosis not present

## 2015-03-31 DIAGNOSIS — M25561 Pain in right knee: Secondary | ICD-10-CM | POA: Diagnosis not present

## 2015-04-03 DIAGNOSIS — M25561 Pain in right knee: Secondary | ICD-10-CM | POA: Diagnosis not present

## 2015-04-07 DIAGNOSIS — M25561 Pain in right knee: Secondary | ICD-10-CM | POA: Diagnosis not present

## 2015-04-09 DIAGNOSIS — M25561 Pain in right knee: Secondary | ICD-10-CM | POA: Diagnosis not present

## 2015-04-13 DIAGNOSIS — M25561 Pain in right knee: Secondary | ICD-10-CM | POA: Diagnosis not present

## 2015-04-20 DIAGNOSIS — M25561 Pain in right knee: Secondary | ICD-10-CM | POA: Diagnosis not present

## 2015-04-27 DIAGNOSIS — M25561 Pain in right knee: Secondary | ICD-10-CM | POA: Diagnosis not present

## 2015-04-29 DIAGNOSIS — M25561 Pain in right knee: Secondary | ICD-10-CM | POA: Diagnosis not present

## 2015-05-05 DIAGNOSIS — M25561 Pain in right knee: Secondary | ICD-10-CM | POA: Diagnosis not present

## 2015-05-11 DIAGNOSIS — M25561 Pain in right knee: Secondary | ICD-10-CM | POA: Diagnosis not present

## 2015-05-19 DIAGNOSIS — M25561 Pain in right knee: Secondary | ICD-10-CM | POA: Diagnosis not present

## 2015-05-26 DIAGNOSIS — M25561 Pain in right knee: Secondary | ICD-10-CM | POA: Diagnosis not present

## 2016-01-06 DIAGNOSIS — Z1231 Encounter for screening mammogram for malignant neoplasm of breast: Secondary | ICD-10-CM | POA: Diagnosis not present

## 2016-01-28 DIAGNOSIS — D509 Iron deficiency anemia, unspecified: Secondary | ICD-10-CM | POA: Diagnosis not present

## 2016-01-28 DIAGNOSIS — I10 Essential (primary) hypertension: Secondary | ICD-10-CM | POA: Diagnosis not present

## 2016-01-28 DIAGNOSIS — E1149 Type 2 diabetes mellitus with other diabetic neurological complication: Secondary | ICD-10-CM | POA: Diagnosis not present

## 2016-01-28 DIAGNOSIS — M25512 Pain in left shoulder: Secondary | ICD-10-CM | POA: Diagnosis not present

## 2016-01-29 DIAGNOSIS — Z471 Aftercare following joint replacement surgery: Secondary | ICD-10-CM | POA: Diagnosis not present

## 2016-01-29 DIAGNOSIS — M7631 Iliotibial band syndrome, right leg: Secondary | ICD-10-CM | POA: Diagnosis not present

## 2016-01-29 DIAGNOSIS — M1711 Unilateral primary osteoarthritis, right knee: Secondary | ICD-10-CM | POA: Diagnosis not present

## 2016-01-29 DIAGNOSIS — Z96651 Presence of right artificial knee joint: Secondary | ICD-10-CM | POA: Diagnosis not present

## 2016-05-31 DIAGNOSIS — Z9181 History of falling: Secondary | ICD-10-CM | POA: Diagnosis not present

## 2016-05-31 DIAGNOSIS — E1149 Type 2 diabetes mellitus with other diabetic neurological complication: Secondary | ICD-10-CM | POA: Diagnosis not present

## 2016-05-31 DIAGNOSIS — E785 Hyperlipidemia, unspecified: Secondary | ICD-10-CM | POA: Diagnosis not present

## 2016-05-31 DIAGNOSIS — I10 Essential (primary) hypertension: Secondary | ICD-10-CM | POA: Diagnosis not present

## 2016-05-31 DIAGNOSIS — Z139 Encounter for screening, unspecified: Secondary | ICD-10-CM | POA: Diagnosis not present

## 2016-05-31 DIAGNOSIS — D509 Iron deficiency anemia, unspecified: Secondary | ICD-10-CM | POA: Diagnosis not present

## 2016-05-31 DIAGNOSIS — R21 Rash and other nonspecific skin eruption: Secondary | ICD-10-CM | POA: Diagnosis not present

## 2016-10-06 DIAGNOSIS — Z1389 Encounter for screening for other disorder: Secondary | ICD-10-CM | POA: Diagnosis not present

## 2016-10-06 DIAGNOSIS — R42 Dizziness and giddiness: Secondary | ICD-10-CM | POA: Diagnosis not present

## 2016-10-06 DIAGNOSIS — E1149 Type 2 diabetes mellitus with other diabetic neurological complication: Secondary | ICD-10-CM | POA: Diagnosis not present

## 2016-10-06 DIAGNOSIS — D509 Iron deficiency anemia, unspecified: Secondary | ICD-10-CM | POA: Diagnosis not present

## 2016-10-06 DIAGNOSIS — I10 Essential (primary) hypertension: Secondary | ICD-10-CM | POA: Diagnosis not present

## 2016-11-02 IMAGING — CR DG CHEST 2V
3 series · 3 of 3 positions shown · non-contrast
Comparison: Portable chest x-ray dated April 24, 2010

CLINICAL DATA: Preoperative exam prior total knee joint
replacement, history of hypertension, nonsmoker.

EXAM:
CHEST  2 VIEW

[w chest pa]
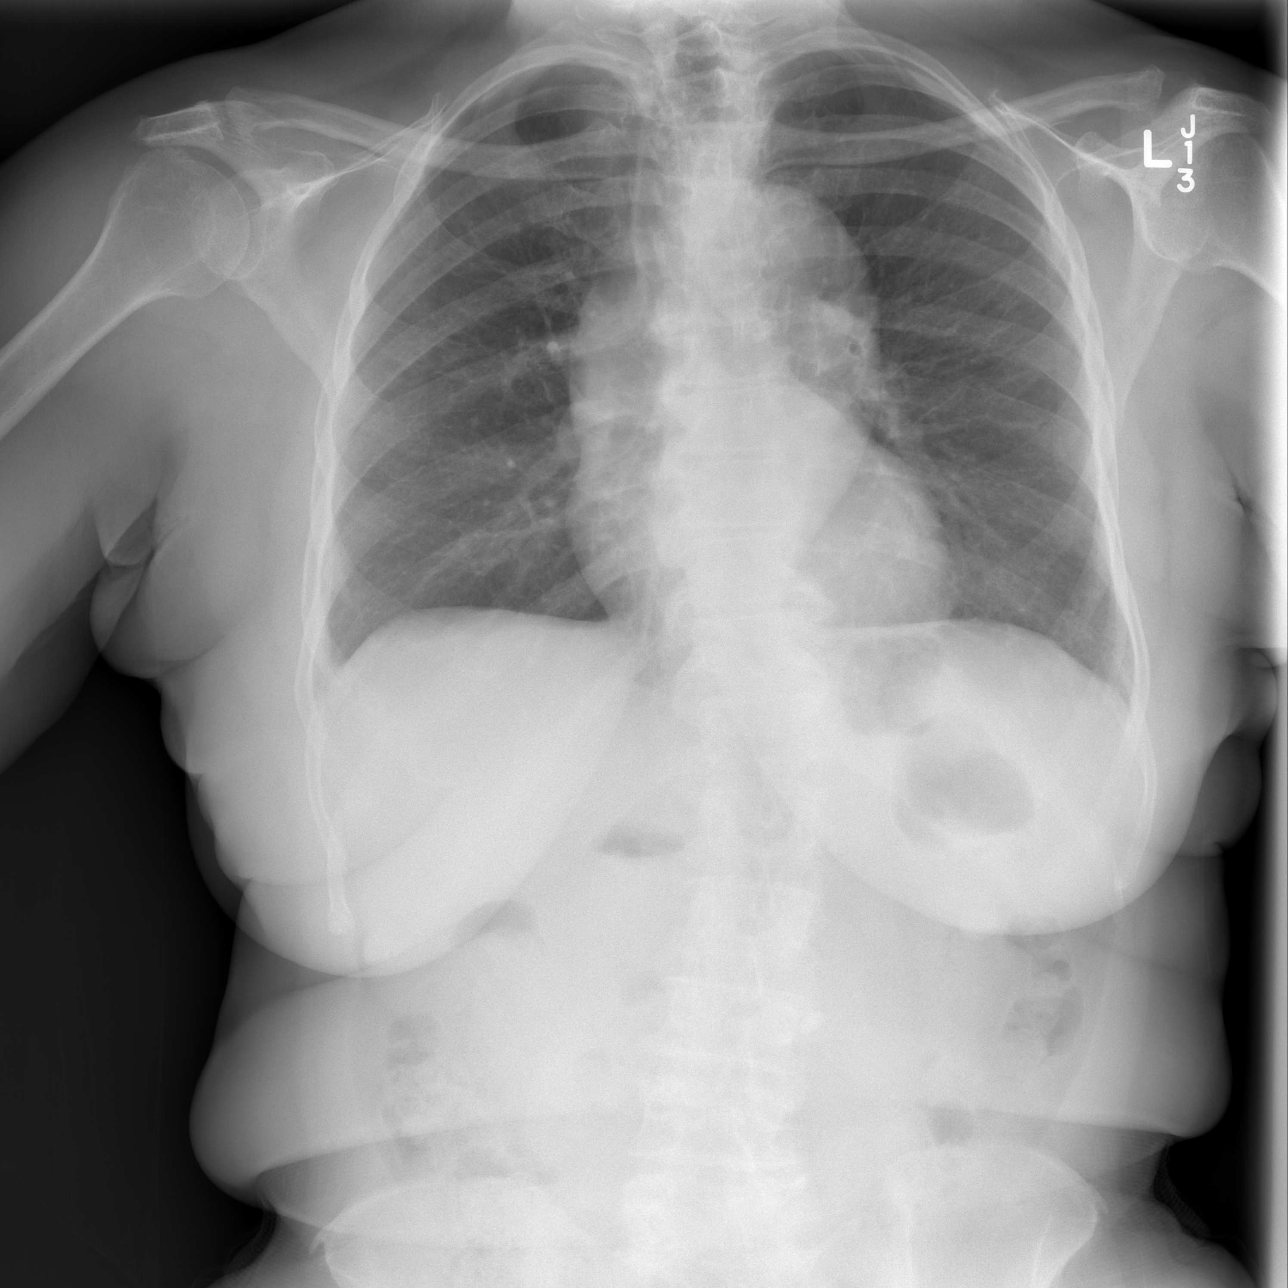

[w chest lat (1 of 2)]
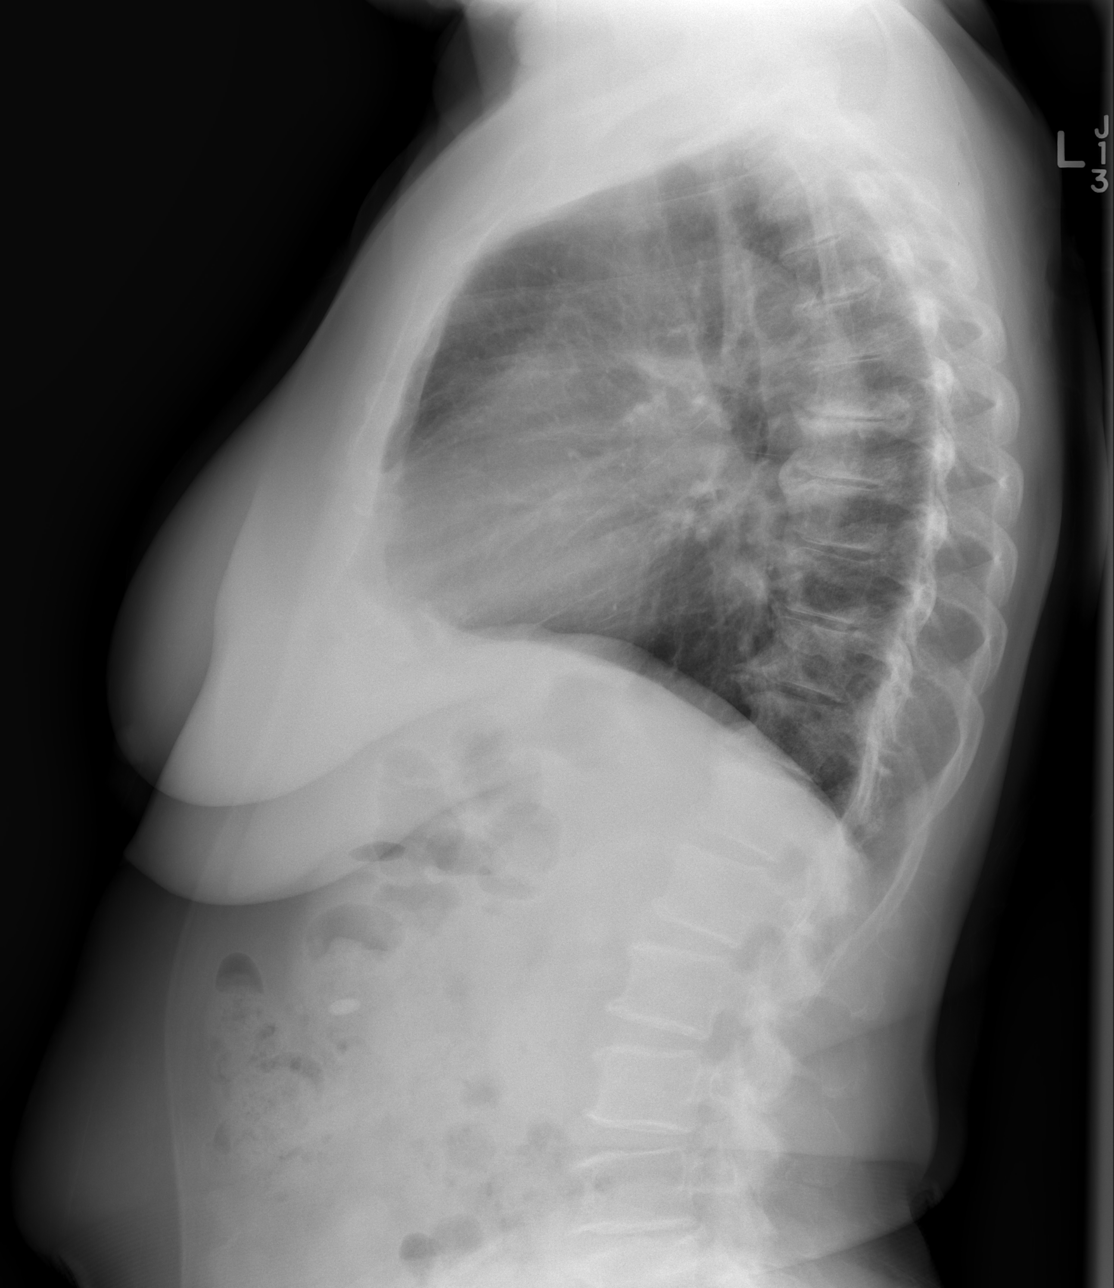

[w chest lat (2 of 2)]
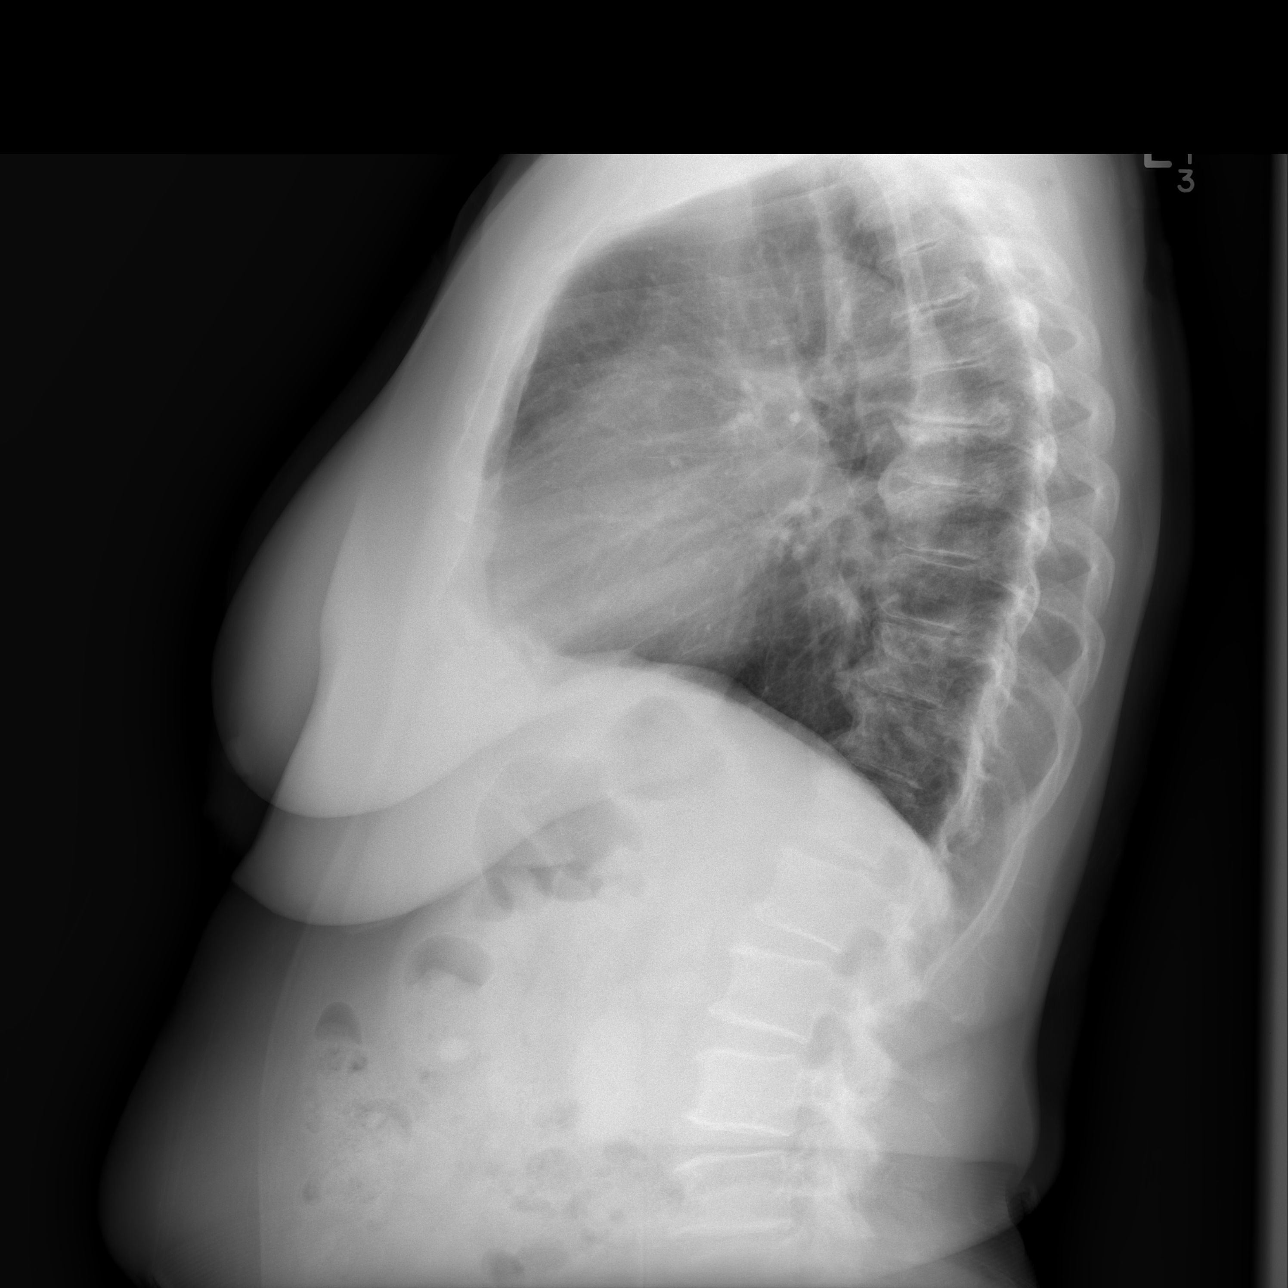

[3 of 3 positions shown; findings below may reference images not displayed]

FINDINGS: The lungs are adequately inflated and clear. There is no pleural
effusion or pneumothorax. The heart and pulmonary vascularity are
normal. There is tortuosity of the ascending and descending thoracic
aorta. There is multilevel degenerative disc disease of the thoracic
spine.
IMPRESSION: There is no active cardiopulmonary disease. There is atherosclerotic
tortuosity of the ascending and descending thoracic aorta.

## 2017-02-09 DIAGNOSIS — E1149 Type 2 diabetes mellitus with other diabetic neurological complication: Secondary | ICD-10-CM | POA: Diagnosis not present

## 2017-02-09 DIAGNOSIS — D509 Iron deficiency anemia, unspecified: Secondary | ICD-10-CM | POA: Diagnosis not present

## 2017-02-09 DIAGNOSIS — I10 Essential (primary) hypertension: Secondary | ICD-10-CM | POA: Diagnosis not present

## 2017-02-09 DIAGNOSIS — E785 Hyperlipidemia, unspecified: Secondary | ICD-10-CM | POA: Diagnosis not present

## 2017-06-09 DIAGNOSIS — E1149 Type 2 diabetes mellitus with other diabetic neurological complication: Secondary | ICD-10-CM | POA: Diagnosis not present

## 2017-06-09 DIAGNOSIS — E785 Hyperlipidemia, unspecified: Secondary | ICD-10-CM | POA: Diagnosis not present

## 2017-06-09 DIAGNOSIS — Z139 Encounter for screening, unspecified: Secondary | ICD-10-CM | POA: Diagnosis not present

## 2017-06-09 DIAGNOSIS — Z9181 History of falling: Secondary | ICD-10-CM | POA: Diagnosis not present

## 2017-06-09 DIAGNOSIS — I10 Essential (primary) hypertension: Secondary | ICD-10-CM | POA: Diagnosis not present

## 2017-06-09 DIAGNOSIS — D509 Iron deficiency anemia, unspecified: Secondary | ICD-10-CM | POA: Diagnosis not present

## 2017-10-09 DIAGNOSIS — Z2821 Immunization not carried out because of patient refusal: Secondary | ICD-10-CM | POA: Diagnosis not present

## 2017-10-09 DIAGNOSIS — E785 Hyperlipidemia, unspecified: Secondary | ICD-10-CM | POA: Diagnosis not present

## 2017-10-09 DIAGNOSIS — E1149 Type 2 diabetes mellitus with other diabetic neurological complication: Secondary | ICD-10-CM | POA: Diagnosis not present

## 2017-10-09 DIAGNOSIS — I10 Essential (primary) hypertension: Secondary | ICD-10-CM | POA: Diagnosis not present

## 2017-10-09 DIAGNOSIS — Z139 Encounter for screening, unspecified: Secondary | ICD-10-CM | POA: Diagnosis not present

## 2017-10-09 DIAGNOSIS — D509 Iron deficiency anemia, unspecified: Secondary | ICD-10-CM | POA: Diagnosis not present

## 2017-10-09 DIAGNOSIS — Z1389 Encounter for screening for other disorder: Secondary | ICD-10-CM | POA: Diagnosis not present

## 2017-10-17 DIAGNOSIS — Z1231 Encounter for screening mammogram for malignant neoplasm of breast: Secondary | ICD-10-CM | POA: Diagnosis not present

## 2017-10-17 DIAGNOSIS — E785 Hyperlipidemia, unspecified: Secondary | ICD-10-CM | POA: Diagnosis not present

## 2017-10-17 DIAGNOSIS — Z1331 Encounter for screening for depression: Secondary | ICD-10-CM | POA: Diagnosis not present

## 2017-10-17 DIAGNOSIS — Z139 Encounter for screening, unspecified: Secondary | ICD-10-CM | POA: Diagnosis not present

## 2017-10-17 DIAGNOSIS — Z0000 Encounter for general adult medical examination without abnormal findings: Secondary | ICD-10-CM | POA: Diagnosis not present

## 2017-10-17 DIAGNOSIS — Z9181 History of falling: Secondary | ICD-10-CM | POA: Diagnosis not present

## 2017-11-01 DIAGNOSIS — Z2821 Immunization not carried out because of patient refusal: Secondary | ICD-10-CM | POA: Diagnosis not present

## 2017-11-01 DIAGNOSIS — I10 Essential (primary) hypertension: Secondary | ICD-10-CM | POA: Diagnosis not present

## 2017-12-26 DIAGNOSIS — M25562 Pain in left knee: Secondary | ICD-10-CM | POA: Diagnosis not present

## 2018-01-11 DIAGNOSIS — G5793 Unspecified mononeuropathy of bilateral lower limbs: Secondary | ICD-10-CM | POA: Diagnosis not present

## 2018-01-11 DIAGNOSIS — I10 Essential (primary) hypertension: Secondary | ICD-10-CM | POA: Diagnosis not present

## 2018-01-11 DIAGNOSIS — D509 Iron deficiency anemia, unspecified: Secondary | ICD-10-CM | POA: Diagnosis not present

## 2018-01-11 DIAGNOSIS — E785 Hyperlipidemia, unspecified: Secondary | ICD-10-CM | POA: Diagnosis not present

## 2018-01-11 DIAGNOSIS — E1149 Type 2 diabetes mellitus with other diabetic neurological complication: Secondary | ICD-10-CM | POA: Diagnosis not present

## 2018-02-12 DIAGNOSIS — E113293 Type 2 diabetes mellitus with mild nonproliferative diabetic retinopathy without macular edema, bilateral: Secondary | ICD-10-CM | POA: Diagnosis not present

## 2018-02-14 DIAGNOSIS — M2041 Other hammer toe(s) (acquired), right foot: Secondary | ICD-10-CM | POA: Diagnosis not present

## 2018-02-14 DIAGNOSIS — B351 Tinea unguium: Secondary | ICD-10-CM | POA: Diagnosis not present

## 2018-02-14 DIAGNOSIS — E119 Type 2 diabetes mellitus without complications: Secondary | ICD-10-CM | POA: Diagnosis not present

## 2018-02-14 DIAGNOSIS — M2042 Other hammer toe(s) (acquired), left foot: Secondary | ICD-10-CM | POA: Diagnosis not present

## 2018-03-07 DIAGNOSIS — M25562 Pain in left knee: Secondary | ICD-10-CM | POA: Diagnosis not present

## 2018-03-07 DIAGNOSIS — B360 Pityriasis versicolor: Secondary | ICD-10-CM | POA: Diagnosis not present

## 2018-05-15 DIAGNOSIS — E1149 Type 2 diabetes mellitus with other diabetic neurological complication: Secondary | ICD-10-CM | POA: Diagnosis not present

## 2018-05-15 DIAGNOSIS — M25562 Pain in left knee: Secondary | ICD-10-CM | POA: Diagnosis not present

## 2018-05-15 DIAGNOSIS — D509 Iron deficiency anemia, unspecified: Secondary | ICD-10-CM | POA: Diagnosis not present

## 2018-05-15 DIAGNOSIS — E785 Hyperlipidemia, unspecified: Secondary | ICD-10-CM | POA: Diagnosis not present

## 2018-05-15 DIAGNOSIS — I10 Essential (primary) hypertension: Secondary | ICD-10-CM | POA: Diagnosis not present

## 2018-06-14 DIAGNOSIS — M1712 Unilateral primary osteoarthritis, left knee: Secondary | ICD-10-CM | POA: Diagnosis not present

## 2018-06-14 DIAGNOSIS — Z96651 Presence of right artificial knee joint: Secondary | ICD-10-CM | POA: Diagnosis not present

## 2018-06-14 DIAGNOSIS — M1711 Unilateral primary osteoarthritis, right knee: Secondary | ICD-10-CM | POA: Diagnosis not present

## 2018-06-14 DIAGNOSIS — Z471 Aftercare following joint replacement surgery: Secondary | ICD-10-CM | POA: Diagnosis not present

## 2018-07-03 DIAGNOSIS — G2581 Restless legs syndrome: Secondary | ICD-10-CM | POA: Diagnosis not present

## 2018-07-19 DIAGNOSIS — M1712 Unilateral primary osteoarthritis, left knee: Secondary | ICD-10-CM | POA: Diagnosis not present

## 2018-08-13 DIAGNOSIS — E113293 Type 2 diabetes mellitus with mild nonproliferative diabetic retinopathy without macular edema, bilateral: Secondary | ICD-10-CM | POA: Diagnosis not present

## 2018-09-20 DIAGNOSIS — E1149 Type 2 diabetes mellitus with other diabetic neurological complication: Secondary | ICD-10-CM | POA: Diagnosis not present

## 2018-09-20 DIAGNOSIS — R200 Anesthesia of skin: Secondary | ICD-10-CM | POA: Diagnosis not present

## 2018-09-20 DIAGNOSIS — E785 Hyperlipidemia, unspecified: Secondary | ICD-10-CM | POA: Diagnosis not present

## 2018-09-20 DIAGNOSIS — D509 Iron deficiency anemia, unspecified: Secondary | ICD-10-CM | POA: Diagnosis not present

## 2018-09-27 DIAGNOSIS — L578 Other skin changes due to chronic exposure to nonionizing radiation: Secondary | ICD-10-CM | POA: Diagnosis not present

## 2018-09-27 DIAGNOSIS — L280 Lichen simplex chronicus: Secondary | ICD-10-CM | POA: Diagnosis not present

## 2018-10-02 DIAGNOSIS — H25811 Combined forms of age-related cataract, right eye: Secondary | ICD-10-CM | POA: Diagnosis not present

## 2018-10-23 DIAGNOSIS — Z7984 Long term (current) use of oral hypoglycemic drugs: Secondary | ICD-10-CM | POA: Diagnosis not present

## 2018-10-23 DIAGNOSIS — I6523 Occlusion and stenosis of bilateral carotid arteries: Secondary | ICD-10-CM | POA: Diagnosis not present

## 2018-10-23 DIAGNOSIS — R55 Syncope and collapse: Secondary | ICD-10-CM | POA: Diagnosis not present

## 2018-10-23 DIAGNOSIS — S299XXA Unspecified injury of thorax, initial encounter: Secondary | ICD-10-CM | POA: Diagnosis not present

## 2018-10-23 DIAGNOSIS — I10 Essential (primary) hypertension: Secondary | ICD-10-CM | POA: Diagnosis not present

## 2018-10-23 DIAGNOSIS — W19XXXA Unspecified fall, initial encounter: Secondary | ICD-10-CM | POA: Diagnosis not present

## 2018-10-23 DIAGNOSIS — I361 Nonrheumatic tricuspid (valve) insufficiency: Secondary | ICD-10-CM | POA: Diagnosis not present

## 2018-10-23 DIAGNOSIS — E11649 Type 2 diabetes mellitus with hypoglycemia without coma: Secondary | ICD-10-CM | POA: Diagnosis not present

## 2018-10-23 DIAGNOSIS — S0990XA Unspecified injury of head, initial encounter: Secondary | ICD-10-CM | POA: Diagnosis not present

## 2018-10-23 DIAGNOSIS — S199XXA Unspecified injury of neck, initial encounter: Secondary | ICD-10-CM | POA: Diagnosis not present

## 2018-10-23 DIAGNOSIS — E119 Type 2 diabetes mellitus without complications: Secondary | ICD-10-CM | POA: Diagnosis not present

## 2018-10-23 DIAGNOSIS — Z79899 Other long term (current) drug therapy: Secondary | ICD-10-CM | POA: Diagnosis not present

## 2018-10-24 DIAGNOSIS — R55 Syncope and collapse: Secondary | ICD-10-CM | POA: Diagnosis not present

## 2018-10-24 DIAGNOSIS — E162 Hypoglycemia, unspecified: Secondary | ICD-10-CM | POA: Diagnosis not present

## 2018-10-30 DIAGNOSIS — E1140 Type 2 diabetes mellitus with diabetic neuropathy, unspecified: Secondary | ICD-10-CM | POA: Diagnosis not present

## 2018-10-30 DIAGNOSIS — D649 Anemia, unspecified: Secondary | ICD-10-CM | POA: Diagnosis not present

## 2018-10-30 DIAGNOSIS — H25811 Combined forms of age-related cataract, right eye: Secondary | ICD-10-CM | POA: Diagnosis not present

## 2018-10-30 DIAGNOSIS — Z79899 Other long term (current) drug therapy: Secondary | ICD-10-CM | POA: Diagnosis not present

## 2018-10-30 DIAGNOSIS — Z7984 Long term (current) use of oral hypoglycemic drugs: Secondary | ICD-10-CM | POA: Diagnosis not present

## 2018-10-30 DIAGNOSIS — M1990 Unspecified osteoarthritis, unspecified site: Secondary | ICD-10-CM | POA: Diagnosis not present

## 2018-10-30 DIAGNOSIS — E113293 Type 2 diabetes mellitus with mild nonproliferative diabetic retinopathy without macular edema, bilateral: Secondary | ICD-10-CM | POA: Diagnosis not present

## 2018-10-30 DIAGNOSIS — I10 Essential (primary) hypertension: Secondary | ICD-10-CM | POA: Diagnosis not present

## 2018-10-30 DIAGNOSIS — E1136 Type 2 diabetes mellitus with diabetic cataract: Secondary | ICD-10-CM | POA: Diagnosis not present

## 2018-10-31 ENCOUNTER — Other Ambulatory Visit: Payer: Self-pay

## 2018-10-31 NOTE — Patient Outreach (Signed)
[facility] Care Management  10/31/2018  Michelle KiefKatie L Allen 03/21/1933 [id number]   Medication Adherence call to Mrs. Angela NevinKatie Allen spoke with patient she explain she has been in the hospital for a week and they provide with all her medication patient has medication at this time patient is due on Losartan/HCTZ 100/25 mg under [employer].   Michelle AbedAna Allen CPhT Pharmacy Technician [facility] Care Management Direct Dial [phone]  Fax [phone] [email or web address]

## 2019-03-21 ENCOUNTER — Other Ambulatory Visit: Payer: Self-pay

## 2019-03-21 NOTE — Patient Outreach (Signed)
[facility]) Care Management  03/21/2019  PIER RAMOS [birth year] [id number]   Medication Adherence call to Mrs. Adrina Savastano patient did not answer patient is past due on Losartan 100 mg under [facility] Ins.   Lillia Abed CPhT Pharmacy Technician [facility] Care Management Direct Dial [phone]  Fax [phone] [email or web address]

## 2019-07-09 DIAGNOSIS — Z1231 Encounter for screening mammogram for malignant neoplasm of breast: Secondary | ICD-10-CM | POA: Diagnosis not present

## 2019-09-19 DIAGNOSIS — E559 Vitamin D deficiency, unspecified: Secondary | ICD-10-CM | POA: Diagnosis not present

## 2019-09-19 DIAGNOSIS — I10 Essential (primary) hypertension: Secondary | ICD-10-CM | POA: Diagnosis not present

## 2019-09-19 DIAGNOSIS — E785 Hyperlipidemia, unspecified: Secondary | ICD-10-CM | POA: Diagnosis not present

## 2019-09-19 DIAGNOSIS — D509 Iron deficiency anemia, unspecified: Secondary | ICD-10-CM | POA: Diagnosis not present

## 2019-09-19 DIAGNOSIS — E1149 Type 2 diabetes mellitus with other diabetic neurological complication: Secondary | ICD-10-CM | POA: Diagnosis not present

## 2019-10-04 ENCOUNTER — Other Ambulatory Visit: Payer: Self-pay

## 2019-10-04 NOTE — Patient Outreach (Signed)
[facility]) Care Management  10/04/2019  Michelle Allen 07/28/1933 [id number]   Medication Adherence call to Michelle Allen Telephone call to Patient regarding Medication Adherence unable to reach patient. Patient did not answer patient is past due on Olmesartan 20 mg under [place].   [place] Management Direct Dial [phone]  Fax [phone] [email or web address]

## 2019-11-05 ENCOUNTER — Other Ambulatory Visit: Payer: Self-pay

## 2019-11-05 NOTE — Patient Outreach (Signed)
[facility]) Care Management  11/05/2019  Michelle Allen 02/10/1933 [id number]   Medication Adherence call to Mrs. Michelle Allen [facility] [employer] spoke with patient she is past due on Olmesartan 20 mg,patient explain she takes 1 tablet daily along other blood pressure,patient ask to call the pharmacy to place an order on this medication,pharmacy will have it ready for patient to pick up.Mrs. Michelle Allen is showing past due under [place].   [place] Management Direct Dial [phone]  Fax [phone] [email or web address]

## 2020-01-14 DIAGNOSIS — I10 Essential (primary) hypertension: Secondary | ICD-10-CM | POA: Diagnosis not present

## 2020-01-14 DIAGNOSIS — E785 Hyperlipidemia, unspecified: Secondary | ICD-10-CM | POA: Diagnosis not present

## 2020-01-14 DIAGNOSIS — D509 Iron deficiency anemia, unspecified: Secondary | ICD-10-CM | POA: Diagnosis not present

## 2020-01-14 DIAGNOSIS — E1149 Type 2 diabetes mellitus with other diabetic neurological complication: Secondary | ICD-10-CM | POA: Diagnosis not present

## 2020-04-16 DIAGNOSIS — E1149 Type 2 diabetes mellitus with other diabetic neurological complication: Secondary | ICD-10-CM | POA: Diagnosis not present

## 2020-04-16 DIAGNOSIS — D509 Iron deficiency anemia, unspecified: Secondary | ICD-10-CM | POA: Diagnosis not present

## 2020-04-16 DIAGNOSIS — I10 Essential (primary) hypertension: Secondary | ICD-10-CM | POA: Diagnosis not present

## 2020-04-16 DIAGNOSIS — E785 Hyperlipidemia, unspecified: Secondary | ICD-10-CM | POA: Diagnosis not present

## 2020-09-04 DIAGNOSIS — D509 Iron deficiency anemia, unspecified: Secondary | ICD-10-CM | POA: Diagnosis not present

## 2020-09-04 DIAGNOSIS — I10 Essential (primary) hypertension: Secondary | ICD-10-CM | POA: Diagnosis not present

## 2020-09-04 DIAGNOSIS — E785 Hyperlipidemia, unspecified: Secondary | ICD-10-CM | POA: Diagnosis not present

## 2020-09-04 DIAGNOSIS — E1149 Type 2 diabetes mellitus with other diabetic neurological complication: Secondary | ICD-10-CM | POA: Diagnosis not present

## 2020-09-28 ENCOUNTER — Other Ambulatory Visit: Payer: Self-pay | Admitting: *Deleted

## 2020-09-28 DIAGNOSIS — Z1231 Encounter for screening mammogram for malignant neoplasm of breast: Secondary | ICD-10-CM

## 2020-10-03 ENCOUNTER — Other Ambulatory Visit: Payer: Self-pay

## 2020-10-03 ENCOUNTER — Ambulatory Visit
Admission: RE | Admit: 2020-10-03 | Discharge: 2020-10-03 | Disposition: A | Discharge status: Home / Self Care | Payer: PRIVATE HEALTH INSURANCE | Source: Ambulatory Visit | Attending: *Deleted | Admitting: *Deleted

## 2020-10-03 DIAGNOSIS — Z1231 Encounter for screening mammogram for malignant neoplasm of breast: Secondary | ICD-10-CM

## 2021-02-19 DIAGNOSIS — R2242 Localized swelling, mass and lump, left lower limb: Secondary | ICD-10-CM | POA: Diagnosis not present

## 2021-02-19 DIAGNOSIS — M25462 Effusion, left knee: Secondary | ICD-10-CM | POA: Diagnosis not present

## 2021-02-22 DIAGNOSIS — M1712 Unilateral primary osteoarthritis, left knee: Secondary | ICD-10-CM | POA: Diagnosis not present

## 2021-03-10 ENCOUNTER — Ambulatory Visit: Payer: Self-pay

## 2021-03-10 ENCOUNTER — Encounter: Payer: Self-pay | Admitting: Orthopaedic Surgery

## 2021-03-10 ENCOUNTER — Ambulatory Visit (INDEPENDENT_AMBULATORY_CARE_PROVIDER_SITE_OTHER): Payer: Medicare Other | Admitting: Orthopaedic Surgery

## 2021-03-10 DIAGNOSIS — G8929 Other chronic pain: Secondary | ICD-10-CM

## 2021-03-10 DIAGNOSIS — M25562 Pain in left knee: Secondary | ICD-10-CM

## 2021-03-10 MED ORDER — LIDOCAINE HCL 1 % IJ SOLN
2.0000 mL | INTRAMUSCULAR | Status: AC | PRN
  Administered 2021-03-10: 2 mL

## 2021-03-10 MED ORDER — METHYLPREDNISOLONE ACETATE 40 MG/ML IJ SUSP
40.0000 mg | INTRAMUSCULAR | Status: AC | PRN
  Administered 2021-03-10: 40 mg via INTRA_ARTICULAR

## 2021-03-10 MED ORDER — BUPIVACAINE HCL 0.25 % IJ SOLN
2.0000 mL | INTRAMUSCULAR | Status: AC | PRN
  Administered 2021-03-10: 2 mL via INTRA_ARTICULAR

## 2021-03-10 NOTE — Progress Notes (Signed)
Office Visit Note   Patient: Michelle Allen           Date of Birth: Jan 20, 1933           MRN: [id number] Visit Date: 03/10/2021              Requested by: [name], NP [address] [place],  [place] [address] PCP: [name], NP   Assessment & Plan: Visit Diagnoses:  1. Chronic pain of left knee     Plan: Impression is left knee advanced degenerative joint disease.  Today we discussed various treatment options to include cortisone injection.  She would like to proceed.  We have also discussed the possibility of total knee arthroplasty in the future should conservative treatments fail to provide significant relief.  She will follow up with [place] as needed.  Follow-Up Instructions: Return if symptoms worsen or fail to improve.   Orders:  Orders Placed This Encounter  Procedures  . Large Joint Inj: L knee  . XR KNEE 3 VIEW LEFT   No orders of the defined types were placed in this encounter.     Procedures: Large Joint Inj: L knee on 03/10/2021 11:33 AM Indications: pain Details: 22 G needle, anterolateral approach Medications: 2 mL lidocaine 1 %; 2 mL bupivacaine 0.25 %; 40 mg methylPREDNISolone acetate 40 MG/ML      Clinical Data: No additional findings.   Subjective: Chief Complaint  Patient presents with  . Left Knee - Pain    HPI patient is a pleasant 87 year old female who comes in today with left knee pain for the past year or so.  No specific injury or change in activity.  The pain she has is primarily to the popliteal fossa.  She has associated instability.  Pain is worse at night when trying to sleep.  She does take an occasional tramadol and Aleve which does seem to relieve her symptoms.  No previous history of cortisone injection to the left knee.  She is status post right total knee replacement by Dr. Darrelyn Hillock years ago.  Review of Systems as detailed in HPI.  All others reviewed and are negative.   Objective: Vital Signs: There were no  vitals taken for this visit.  Physical Exam well-developed well-nourished female no acute distress.  Alert oriented x3  Ortho Exam examination of the left knee shows trace effusion.  Range of motion 0 to 115 degrees.  No joint line tenderness.  She does have fullness to the popliteal fossa.  No calf tenderness.  She is stable valgus varus stress.  She is neurovascular intact distally.  Specialty Comments:  No specialty comments available.  Imaging: XR KNEE 3 VIEW LEFT  Result Date: 03/10/2021 X-rays demonstrate advanced degenerative changes to the medial and patellofemoral compartments.     PMFS History: Patient Active Problem List   Diagnosis Date Noted  . History of total knee arthroplasty 02/13/2015   Past Medical History:  Diagnosis Date  . Anemia   . Arthritis   . Diabetes mellitus without complication (HCC)   . Hyperlipidemia   . Hypertension   . Rash    rt side of neck    History reviewed. No pertinent family history.  Past Surgical History:  Procedure Laterality Date  . BREAST LUMPECTOMY  44 yrs ago   benign  . CESAREAN SECTION    . HAND SURGERY  44 yrs ago   left  . TOTAL KNEE ARTHROPLASTY Right 02/13/2015  Procedure: RIGHT TOTAL KNEE ARTHROPLASTY;  Surgeon: Jacki Cones, MD;  Location: [facility];  Service: Orthopedics;  Laterality: Right;   Social History   Occupational History  . Not on file  Tobacco Use  . Smoking status: Never Smoker  . Smokeless tobacco: Not on file  Substance and Sexual Activity  . Alcohol use: Yes    Comment: very rare  . Drug use: No  . Sexual activity: Not on file

## 2021-03-16 DIAGNOSIS — E785 Hyperlipidemia, unspecified: Secondary | ICD-10-CM | POA: Diagnosis not present

## 2021-03-16 DIAGNOSIS — G5793 Unspecified mononeuropathy of bilateral lower limbs: Secondary | ICD-10-CM | POA: Diagnosis not present

## 2021-03-16 DIAGNOSIS — I10 Essential (primary) hypertension: Secondary | ICD-10-CM | POA: Diagnosis not present

## 2021-03-16 DIAGNOSIS — E559 Vitamin D deficiency, unspecified: Secondary | ICD-10-CM | POA: Diagnosis not present

## 2021-03-16 DIAGNOSIS — E1149 Type 2 diabetes mellitus with other diabetic neurological complication: Secondary | ICD-10-CM | POA: Diagnosis not present

## 2021-03-16 DIAGNOSIS — D509 Iron deficiency anemia, unspecified: Secondary | ICD-10-CM | POA: Diagnosis not present

## 2021-03-19 ENCOUNTER — Telehealth: Payer: Self-pay | Admitting: Orthopaedic Surgery

## 2021-03-19 NOTE — Telephone Encounter (Signed)
Holding for [employer]

## 2021-03-19 NOTE — Telephone Encounter (Signed)
Received call from Barnabas Lister with [facility] ([place] needing note from office visit dated 03/10/2021. Fax# is [phone] The number to contact Lupita Leash is [phone] Ext: [phone]

## 2021-03-30 DIAGNOSIS — D649 Anemia, unspecified: Secondary | ICD-10-CM | POA: Diagnosis not present

## 2021-03-30 DIAGNOSIS — K921 Melena: Secondary | ICD-10-CM | POA: Diagnosis not present

## 2021-03-30 DIAGNOSIS — Z1212 Encounter for screening for malignant neoplasm of rectum: Secondary | ICD-10-CM | POA: Diagnosis not present

## 2021-05-26 DIAGNOSIS — K921 Melena: Secondary | ICD-10-CM | POA: Diagnosis not present

## 2021-05-26 DIAGNOSIS — D649 Anemia, unspecified: Secondary | ICD-10-CM | POA: Diagnosis not present

## 2021-05-26 DIAGNOSIS — K5730 Diverticulosis of large intestine without perforation or abscess without bleeding: Secondary | ICD-10-CM | POA: Diagnosis not present

## 2021-05-26 DIAGNOSIS — K625 Hemorrhage of anus and rectum: Secondary | ICD-10-CM | POA: Diagnosis not present

## 2021-08-24 DIAGNOSIS — G5793 Unspecified mononeuropathy of bilateral lower limbs: Secondary | ICD-10-CM | POA: Diagnosis not present

## 2021-08-24 DIAGNOSIS — D509 Iron deficiency anemia, unspecified: Secondary | ICD-10-CM | POA: Diagnosis not present

## 2021-08-24 DIAGNOSIS — E1149 Type 2 diabetes mellitus with other diabetic neurological complication: Secondary | ICD-10-CM | POA: Diagnosis not present

## 2021-08-24 DIAGNOSIS — E785 Hyperlipidemia, unspecified: Secondary | ICD-10-CM | POA: Diagnosis not present

## 2021-08-24 DIAGNOSIS — E559 Vitamin D deficiency, unspecified: Secondary | ICD-10-CM | POA: Diagnosis not present

## 2021-08-24 DIAGNOSIS — I10 Essential (primary) hypertension: Secondary | ICD-10-CM | POA: Diagnosis not present

## 2021-10-08 DIAGNOSIS — D649 Anemia, unspecified: Secondary | ICD-10-CM | POA: Diagnosis not present

## 2021-10-13 DIAGNOSIS — J209 Acute bronchitis, unspecified: Secondary | ICD-10-CM | POA: Diagnosis not present

## 2021-10-13 DIAGNOSIS — R0981 Nasal congestion: Secondary | ICD-10-CM | POA: Diagnosis not present

## 2021-10-13 DIAGNOSIS — R051 Acute cough: Secondary | ICD-10-CM | POA: Diagnosis not present

## 2021-10-13 DIAGNOSIS — R0789 Other chest pain: Secondary | ICD-10-CM | POA: Diagnosis not present

## 2021-10-15 DIAGNOSIS — D649 Anemia, unspecified: Secondary | ICD-10-CM | POA: Diagnosis not present

## 2021-10-19 DIAGNOSIS — D649 Anemia, unspecified: Secondary | ICD-10-CM | POA: Diagnosis not present

## 2021-10-19 DIAGNOSIS — K921 Melena: Secondary | ICD-10-CM | POA: Diagnosis not present

## 2021-11-01 DIAGNOSIS — Z0000 Encounter for general adult medical examination without abnormal findings: Secondary | ICD-10-CM | POA: Diagnosis not present

## 2021-11-01 DIAGNOSIS — E785 Hyperlipidemia, unspecified: Secondary | ICD-10-CM | POA: Diagnosis not present

## 2021-11-01 DIAGNOSIS — Z9181 History of falling: Secondary | ICD-10-CM | POA: Diagnosis not present

## 2021-11-01 DIAGNOSIS — Z139 Encounter for screening, unspecified: Secondary | ICD-10-CM | POA: Diagnosis not present

## 2021-11-02 DIAGNOSIS — R0600 Dyspnea, unspecified: Secondary | ICD-10-CM | POA: Diagnosis not present

## 2021-11-02 DIAGNOSIS — J209 Acute bronchitis, unspecified: Secondary | ICD-10-CM | POA: Diagnosis not present

## 2021-11-02 DIAGNOSIS — R0689 Other abnormalities of breathing: Secondary | ICD-10-CM | POA: Diagnosis not present

## 2021-11-02 DIAGNOSIS — R531 Weakness: Secondary | ICD-10-CM | POA: Diagnosis not present

## 2021-11-02 DIAGNOSIS — D649 Anemia, unspecified: Secondary | ICD-10-CM | POA: Diagnosis not present

## 2021-11-08 DIAGNOSIS — R0689 Other abnormalities of breathing: Secondary | ICD-10-CM | POA: Diagnosis not present

## 2021-11-08 DIAGNOSIS — R0600 Dyspnea, unspecified: Secondary | ICD-10-CM | POA: Diagnosis not present

## 2021-11-23 ENCOUNTER — Telehealth: Payer: Self-pay | Admitting: Oncology

## 2021-11-23 NOTE — Telephone Encounter (Signed)
Scheduled appt per 12/5 referral. Pt is aware of appt date and time.  

## 2021-12-03 DIAGNOSIS — R0689 Other abnormalities of breathing: Secondary | ICD-10-CM | POA: Diagnosis not present

## 2021-12-03 DIAGNOSIS — G5793 Unspecified mononeuropathy of bilateral lower limbs: Secondary | ICD-10-CM | POA: Diagnosis not present

## 2021-12-03 DIAGNOSIS — E785 Hyperlipidemia, unspecified: Secondary | ICD-10-CM | POA: Diagnosis not present

## 2021-12-03 DIAGNOSIS — R0600 Dyspnea, unspecified: Secondary | ICD-10-CM | POA: Diagnosis not present

## 2021-12-03 DIAGNOSIS — E559 Vitamin D deficiency, unspecified: Secondary | ICD-10-CM | POA: Diagnosis not present

## 2021-12-03 DIAGNOSIS — Z2821 Immunization not carried out because of patient refusal: Secondary | ICD-10-CM | POA: Diagnosis not present

## 2021-12-03 DIAGNOSIS — E1149 Type 2 diabetes mellitus with other diabetic neurological complication: Secondary | ICD-10-CM | POA: Diagnosis not present

## 2021-12-03 DIAGNOSIS — I10 Essential (primary) hypertension: Secondary | ICD-10-CM | POA: Diagnosis not present

## 2021-12-03 DIAGNOSIS — E039 Hypothyroidism, unspecified: Secondary | ICD-10-CM | POA: Diagnosis not present

## 2021-12-03 DIAGNOSIS — D649 Anemia, unspecified: Secondary | ICD-10-CM | POA: Diagnosis not present

## 2021-12-08 ENCOUNTER — Encounter: Payer: Self-pay | Admitting: Oncology

## 2021-12-08 ENCOUNTER — Inpatient Hospital Stay: Payer: Medicare Other

## 2021-12-08 ENCOUNTER — Other Ambulatory Visit: Payer: Self-pay | Admitting: Oncology

## 2021-12-08 ENCOUNTER — Inpatient Hospital Stay: Payer: Medicare Other | Attending: Oncology | Admitting: Oncology

## 2021-12-08 ENCOUNTER — Telehealth: Payer: Self-pay | Admitting: Oncology

## 2021-12-08 DIAGNOSIS — D509 Iron deficiency anemia, unspecified: Secondary | ICD-10-CM | POA: Diagnosis not present

## 2021-12-08 DIAGNOSIS — Z809 Family history of malignant neoplasm, unspecified: Secondary | ICD-10-CM

## 2021-12-08 DIAGNOSIS — Z8042 Family history of malignant neoplasm of prostate: Secondary | ICD-10-CM

## 2021-12-08 DIAGNOSIS — D508 Other iron deficiency anemias: Secondary | ICD-10-CM

## 2021-12-08 DIAGNOSIS — Z79899 Other long term (current) drug therapy: Secondary | ICD-10-CM | POA: Diagnosis not present

## 2021-12-08 DIAGNOSIS — D500 Iron deficiency anemia secondary to blood loss (chronic): Secondary | ICD-10-CM | POA: Insufficient documentation

## 2021-12-08 LAB — CBC AND DIFFERENTIAL
HCT: 30 — AB (ref 36–46)
Hemoglobin: 9.6 — AB (ref 12.0–16.0)
Neutrophils Absolute: 2.96
Platelets: 195 (ref 150–399)
WBC: 5.1

## 2021-12-08 LAB — BASIC METABOLIC PANEL
BUN: 20 (ref 4–21)
CO2: 23 — AB (ref 13–22)
Chloride: 110 — AB (ref 99–108)
Creatinine: 1 (ref 0.5–1.1)
Glucose: 146
Potassium: 3.8 (ref 3.4–5.3)
Sodium: 140 (ref 137–147)

## 2021-12-08 LAB — FOLATE: Folate: 15.7 ng/mL (ref >5.9)

## 2021-12-08 LAB — HEPATIC FUNCTION PANEL
ALT: 18 (ref 7–35)
AST: 25 (ref 13–35)
Alkaline Phosphatase: 120 (ref 25–125)
Bilirubin, Total: 0.6

## 2021-12-08 LAB — COMPREHENSIVE METABOLIC PANEL
Albumin: 3.8 (ref 3.5–5.0)
Calcium: 9.1 (ref 8.7–10.7)

## 2021-12-08 LAB — IRON AND TIBC
Iron: 79 ug/dL (ref 28–170)
Saturation Ratios: 25 % (ref 10.4–31.8)
TIBC: 319 ug/dL (ref 250–450)
UIBC: 240 ug/dL

## 2021-12-08 LAB — VITAMIN B12: Vitamin B-12: 747 pg/mL (ref 180–914)

## 2021-12-08 LAB — CBC: RBC: 3.62 — AB (ref 3.87–5.11)

## 2021-12-08 NOTE — Progress Notes (Signed)
Michelle Allen  [address] [place],  [place]  [address] [phone]  Clinic Day:  12/08/2021  Referring physician: Lowella Dandy, NP   HISTORY OF PRESENT ILLNESS:  The patient is an 87 y.o. female  who I was asked to consult upon for iron deficiency anemia.  Recent labs showed a low hemoglobin of  10.1, with a low MCV of 80.  However, iron studies done recently showed a normal ferritin of 131.  The patient claims she has had GI blood loss intermittently for multiple months.  However, a recent colonoscopy did not reveal any type of ominous GI tract pathology.  The patient claims she was taking 1 iron pill daily for numerous months before stopping it recently.  Outside of her issues with GI blood loss, she denies having other overt forms of blood loss.  The patient denies being placed on any new medications recently which have the ability to cause anemia via bone marrow suppression.  She claims her mother had issues with anemia.  There is no other family history of anemia or other hematologic disorders   PAST MEDICAL HISTORY:   Past Medical History:  Diagnosis Date   Anemia    Arthritis    Diabetes mellitus without complication ([place])    Hyperlipidemia    Hypertension    Rash    rt side of neck    PAST SURGICAL HISTORY:   Past Surgical History:  Procedure Laterality Date   BREAST LUMPECTOMY  44 yrs ago   benign   CESAREAN SECTION     HAND SURGERY  44 yrs ago   left   TOTAL KNEE ARTHROPLASTY Right 02/13/2015   Procedure: RIGHT TOTAL KNEE ARTHROPLASTY;  Surgeon: Tobi Bastos, MD;  Location: [facility];  Service: Orthopedics;  Laterality: Right;    CURRENT MEDICATIONS:   Current Outpatient Medications  Medication Sig Dispense Refill   ferrous fumarate (HEMOCYTE - 106 MG FE) 325 (106 FE) MG TABS tablet Take 1 tablet by mouth daily.     glimepiride (AMARYL) 4 MG tablet Take 4 mg by mouth daily with breakfast.     losartan-hydrochlorothiazide  (HYZAAR) 100-25 MG per tablet Take 1 tablet by mouth every morning.     meclizine (ANTIVERT) 25 MG tablet Take 25 mg by mouth 3 (three) times daily as needed for dizziness.     methocarbamol (ROBAXIN) 500 MG tablet Take 1 tablet (500 mg total) by mouth every 6 (six) hours as needed for muscle spasms. 40 tablet 1   oxyCODONE 10 MG TABS Take 0.5-1 tablets (5-10 mg total) by mouth every 4 (four) hours as needed for moderate pain (79m-10mg). 80 tablet 0   rivaroxaban (XARELTO) 10 MG TABS tablet Take 1 tablet (10 mg total) by mouth daily with breakfast. 12 tablet 20   traMADol (ULTRAM) 50 MG tablet Take 50 mg by mouth every 6 (six) hours as needed for moderate pain.     No current facility-administered medications for this visit.    ALLERGIES:   Allergies  Allergen Reactions   Penicillins Rash    FAMILY HISTORY:  Her father died of complications from hypertension.  Her mother died from complications of dementia.  She has 5 brothers, 1 of whom died from an unspecified cancer.  Another brother has prostate cancer.  She also has 6 sisters who are all relatively healthy.  SOCIAL HISTORY:  The patient was born [place] in [place]  She currently lives in town.  She is single, with 1 son and 5 grandchildren.  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  There is no history of alcoholism or tobacco abuse.  REVIEW OF SYSTEMS:  Review of Systems  Constitutional:  Negative for fatigue and fever.  HENT:   Negative for hearing loss and sore throat.   Eyes:  Negative for eye problems.  Respiratory:  Positive for shortness of breath. Negative for chest tightness, cough and hemoptysis.   Cardiovascular:  Negative for chest pain and palpitations.  Gastrointestinal:  Negative for abdominal distention, abdominal pain, blood in stool, constipation, diarrhea, nausea and vomiting.  Endocrine: Negative for hot flashes.  Genitourinary:   Negative for difficulty urinating, dysuria, frequency, hematuria and nocturia.   Musculoskeletal:  Positive for gait problem. Negative for arthralgias, back pain and myalgias.  Skin:  Positive for rash. Negative for itching.  Neurological:  Positive for gait problem. Negative for dizziness, extremity weakness, headaches, light-headedness and numbness.  Hematological: Negative.   Psychiatric/Behavioral: Negative.  Negative for depression and suicidal ideas. The patient is not nervous/anxious.     PHYSICAL EXAM:  There were no vitals taken for this visit. Wt Readings from Last 3 Encounters:  02/13/15 151 lb (68.5 kg)  02/09/15 151 lb (68.5 kg)   There is no height or weight on file to calculate BMI. Performance status (ECOG): 1 - Symptomatic but completely ambulatory Physical Exam Constitutional:      Appearance: Normal appearance. She is not ill-appearing.  HENT:     Mouth/Throat:     Mouth: Mucous membranes are moist.     Pharynx: Oropharynx is clear. No oropharyngeal exudate or posterior oropharyngeal erythema.  Cardiovascular:     Rate and Rhythm: Normal rate and regular rhythm.     Heart sounds: No murmur heard.   No friction rub. No gallop.  Pulmonary:     Effort: Pulmonary effort is normal. No respiratory distress.     Breath sounds: Normal breath sounds. No wheezing, rhonchi or rales.  Abdominal:     General: Bowel sounds are normal. There is no distension.     Palpations: Abdomen is soft. There is no mass.     Tenderness: There is no abdominal tenderness.  Musculoskeletal:        General: No swelling.     Right lower leg: No edema.     Left lower leg: No edema.  Lymphadenopathy:     Cervical: No cervical adenopathy.     Upper Body:     Right upper body: No supraclavicular or axillary adenopathy.     Left upper body: No supraclavicular or axillary adenopathy.     Lower Body: No right inguinal adenopathy. No left inguinal adenopathy.  Skin:    General: Skin is warm.      Coloration: Skin is not jaundiced.     Findings: No lesion or rash.  Neurological:     General: No focal deficit present.     Mental Status: She is alert and oriented to person, place, and time. Mental status is at baseline.  Psychiatric:        Mood and Affect: Mood normal.        Behavior: Behavior normal.        Thought Content: Thought content normal.    LABS:      Latest Reference Range & Units 12/08/21 11:04  Iron 28 - 170 ug/dL 79  UIBC ug/dL 240  TIBC  250 - 450 ug/dL 319  Saturation Ratios 10.4 - 31.8 % 25  Folate >5.9 ng/mL 15.7  Vitamin B12 180 - 914 pg/mL 747   ASSESSMENT & PLAN:  An 87 y.o. female who I was asked to consult upon anemia.  When evaluating all of her recent labs, her iron parameters do not suggest obvious iron deficiency anemia is present.  However, this patient's MCV has fallen over these past few years.  Furthermore, she has had issues with GI blood loss.  Based upon this, I will arrange for her to receive IV iron over these next few weeks to see how well it will improve both her hemoglobin and iron stores.  I will see her back in 3 months for repeat clinical assessment.  If her hemoglobin remains suboptimal at that time, she may ultimately need a bone marrow biopsy to rule out any type of intrinsic marrow disorder behind her anemia. The patient understands all the plans discussed today and is in agreement with them.  I do appreciate Moon, Amy A, NP for his new consult.   Blondine Hottel Macarthur Critchley, MD

## 2021-12-08 NOTE — Telephone Encounter (Signed)
Per 12/21 los next appt scheduled and given to patient °

## 2021-12-09 ENCOUNTER — Telehealth: Payer: Self-pay | Admitting: Oncology

## 2021-12-09 NOTE — Telephone Encounter (Signed)
12/09/22 spoke with patient and confirmed all appts

## 2021-12-16 ENCOUNTER — Encounter: Payer: Self-pay | Admitting: Oncology

## 2021-12-21 ENCOUNTER — Other Ambulatory Visit: Payer: Self-pay | Admitting: Pharmacist

## 2021-12-23 ENCOUNTER — Telehealth: Payer: Self-pay | Admitting: Oncology

## 2021-12-23 ENCOUNTER — Ambulatory Visit: Payer: Medicare Other

## 2021-12-23 DIAGNOSIS — J069 Acute upper respiratory infection, unspecified: Secondary | ICD-10-CM | POA: Diagnosis not present

## 2021-12-23 NOTE — Telephone Encounter (Signed)
12/23/21 LVM -RESCHED ALL APPTS

## 2021-12-27 ENCOUNTER — Inpatient Hospital Stay: Payer: Medicare Other

## 2021-12-31 ENCOUNTER — Encounter: Payer: Self-pay | Admitting: Oncology

## 2021-12-31 MED FILL — Ferumoxytol Inj 510 MG/17ML (30 MG/ML) (Elemental Fe): INTRAVENOUS | Qty: 17 | Status: AC

## 2022-01-03 ENCOUNTER — Ambulatory Visit: Payer: Medicare Other

## 2022-01-03 ENCOUNTER — Other Ambulatory Visit: Payer: Self-pay

## 2022-01-03 ENCOUNTER — Other Ambulatory Visit: Payer: Self-pay | Admitting: Pharmacist

## 2022-01-03 ENCOUNTER — Inpatient Hospital Stay: Payer: Medicare Other | Attending: Oncology

## 2022-01-03 VITALS — BP 170/78 | HR 74 | Temp 97.5°F | Resp 18 | Wt 136.0 lb

## 2022-01-03 DIAGNOSIS — D509 Iron deficiency anemia, unspecified: Secondary | ICD-10-CM | POA: Insufficient documentation

## 2022-01-03 DIAGNOSIS — D500 Iron deficiency anemia secondary to blood loss (chronic): Secondary | ICD-10-CM

## 2022-01-03 MED ORDER — SODIUM CHLORIDE 0.9 % IV SOLN: Freq: Once | INTRAVENOUS | Status: AC

## 2022-01-03 MED ORDER — SODIUM CHLORIDE 0.9 % IV SOLN
510.0000 mg | Freq: Once | INTRAVENOUS | Status: AC
  Administered 2022-01-03: 510 mg via INTRAVENOUS
  Filled 2022-01-03: qty 17

## 2022-01-04 DIAGNOSIS — I10 Essential (primary) hypertension: Secondary | ICD-10-CM | POA: Diagnosis not present

## 2022-01-04 DIAGNOSIS — E039 Hypothyroidism, unspecified: Secondary | ICD-10-CM | POA: Diagnosis not present

## 2022-01-06 ENCOUNTER — Ambulatory Visit: Payer: Medicare Other

## 2022-01-10 ENCOUNTER — Inpatient Hospital Stay: Payer: Medicare Other

## 2022-01-10 ENCOUNTER — Other Ambulatory Visit: Payer: Self-pay

## 2022-01-10 VITALS — BP 171/89 | HR 81 | Temp 98.1°F | Resp 18 | Ht 62.0 in | Wt 136.0 lb

## 2022-01-10 DIAGNOSIS — D509 Iron deficiency anemia, unspecified: Secondary | ICD-10-CM | POA: Diagnosis not present

## 2022-01-10 DIAGNOSIS — D500 Iron deficiency anemia secondary to blood loss (chronic): Secondary | ICD-10-CM

## 2022-01-10 MED ORDER — SODIUM CHLORIDE 0.9 % IV SOLN
510.0000 mg | Freq: Once | INTRAVENOUS | Status: AC
  Administered 2022-01-10: 510 mg via INTRAVENOUS
  Filled 2022-01-10: qty 17

## 2022-01-10 MED ORDER — SODIUM CHLORIDE 0.9 % IV SOLN: Freq: Once | INTRAVENOUS | Status: AC

## 2022-01-10 NOTE — Patient Instructions (Signed)

## 2022-02-08 DIAGNOSIS — E1149 Type 2 diabetes mellitus with other diabetic neurological complication: Secondary | ICD-10-CM | POA: Diagnosis not present

## 2022-02-08 DIAGNOSIS — I10 Essential (primary) hypertension: Secondary | ICD-10-CM | POA: Diagnosis not present

## 2022-02-08 DIAGNOSIS — G5793 Unspecified mononeuropathy of bilateral lower limbs: Secondary | ICD-10-CM | POA: Diagnosis not present

## 2022-02-08 DIAGNOSIS — D509 Iron deficiency anemia, unspecified: Secondary | ICD-10-CM | POA: Diagnosis not present

## 2022-02-08 DIAGNOSIS — E039 Hypothyroidism, unspecified: Secondary | ICD-10-CM | POA: Diagnosis not present

## 2022-02-08 DIAGNOSIS — E785 Hyperlipidemia, unspecified: Secondary | ICD-10-CM | POA: Diagnosis not present

## 2022-02-08 DIAGNOSIS — M1990 Unspecified osteoarthritis, unspecified site: Secondary | ICD-10-CM | POA: Diagnosis not present

## 2022-03-07 NOTE — Progress Notes (Signed)
? ? ?[address] [facility]  ?[address] ?[place],  [place]  [address] ?[phone] ? ?Clinic Day:  03/08/2022 ? ?Referring physician: [name], NP ? ? ?HISTORY OF PRESENT ILLNESS:  ?The patient is an 88 y.o. female  with iron deficiency anemia.  She comes in today to reassess her anemia after receiving IV iron.  Since her last visit, the patient has been doing well.  She denies having any overt forms of blood loss.  Previously, she had intermittent GI blood loss for multiple months.  However, a colonoscopy did not reveal any type of ominous lower GI tract pathology.  ? ?PHYSICAL EXAM:  ?Blood pressure (!) 219/93, pulse 70, temperature 98.2 ?F (36.8 ?C), resp. rate 16, height 5\' 2"  (1.575 m), weight 137 lb 6.4 oz (62.3 kg), SpO2 96 %. ?Wt Readings from Last 3 Encounters:  ?03/08/22 137 lb 6.4 oz (62.3 kg)  ?01/10/22 136 lb (61.7 kg)  ?01/03/22 136 lb (61.7 kg)  ? ?Body mass index is 25.13 kg/m?01/05/22 ?Performance status (ECOG): 1 - Symptomatic but completely ambulatory ?Physical Exam ?Constitutional:   ?   Appearance: Normal appearance. She is not ill-appearing.  ?HENT:  ?   Mouth/Throat:  ?   Mouth: Mucous membranes are moist.  ?   Pharynx: Oropharynx is clear. No oropharyngeal exudate or posterior oropharyngeal erythema.  ?Cardiovascular:  ?   Rate and Rhythm: Normal rate and regular rhythm.  ?   Heart sounds: No murmur heard. ?  No friction rub. No gallop.  ?Pulmonary:  ?   Effort: Pulmonary effort is normal. No respiratory distress.  ?   Breath sounds: Normal breath sounds. No wheezing, rhonchi or rales.  ?Abdominal:  ?   General: Bowel sounds are normal. There is no distension.  ?   Palpations: Abdomen is soft. There is no mass.  ?   Tenderness: There is no abdominal tenderness.  ?Musculoskeletal:     ?   General: No swelling.  ?   Right lower leg: No edema.  ?   Left lower leg: No edema.  ?Lymphadenopathy:  ?   Cervical: No cervical adenopathy.  ?   Upper Body:  ?   Right upper  body: No supraclavicular or axillary adenopathy.  ?   Left upper body: No supraclavicular or axillary adenopathy.  ?   Lower Body: No right inguinal adenopathy. No left inguinal adenopathy.  ?Skin: ?   General: Skin is warm.  ?   Coloration: Skin is not jaundiced.  ?   Findings: No lesion or rash.  ?Neurological:  ?   General: No focal deficit present.  ?   Mental Status: She is alert and oriented to person, place, and time. Mental status is at baseline.  ?Psychiatric:     ?   Mood and Affect: Mood normal.     ?   Behavior: Behavior normal.     ?   Thought Content: Thought content normal.  ? ? ?LABS:  ? ? ? Latest Reference Range & Units 03/08/22 09:19  ?Ferritin 11 - 307 ng/mL 605 (H)  ?(H): Data is abnormally high ? ?ASSESSMENT & PLAN:  ?An 88 y.o. female with iron deficiency anemia.  I am pleased as her hemoglobin has improved after her recent IV iron.  Although not normal, her hemoglobin level today is one of the highest levels she has had in years.  Clinically, she appears to be doing well.  I will see her back in 4  months for repeat clinical assessment.  The patient understands all the plans discussed today and is in agreement with them. ? ?Coriana Angello Kirby Funk, MD   ? ? ?  ?

## 2022-03-08 ENCOUNTER — Inpatient Hospital Stay: Payer: Medicare Other | Attending: Oncology | Admitting: Oncology

## 2022-03-08 ENCOUNTER — Other Ambulatory Visit: Payer: Self-pay

## 2022-03-08 ENCOUNTER — Telehealth: Payer: Self-pay | Admitting: Oncology

## 2022-03-08 ENCOUNTER — Inpatient Hospital Stay: Payer: Medicare Other

## 2022-03-08 ENCOUNTER — Other Ambulatory Visit: Payer: Self-pay | Admitting: Oncology

## 2022-03-08 VITALS — BP 219/93 | HR 70 | Temp 98.2°F | Resp 16 | Ht 62.0 in | Wt 137.4 lb

## 2022-03-08 DIAGNOSIS — D509 Iron deficiency anemia, unspecified: Secondary | ICD-10-CM | POA: Diagnosis not present

## 2022-03-08 DIAGNOSIS — D508 Other iron deficiency anemias: Secondary | ICD-10-CM

## 2022-03-08 DIAGNOSIS — D500 Iron deficiency anemia secondary to blood loss (chronic): Secondary | ICD-10-CM

## 2022-03-08 DIAGNOSIS — D649 Anemia, unspecified: Secondary | ICD-10-CM | POA: Diagnosis not present

## 2022-03-08 LAB — CBC AND DIFFERENTIAL
HCT: 35 — AB (ref 36–46)
Hemoglobin: 11.2 — AB (ref 12.0–16.0)
Neutrophils Absolute: 2.81
Platelets: 189 10*3/uL (ref 150–400)
WBC: 5.1

## 2022-03-08 LAB — FERRITIN: Ferritin: 605 ng/mL — ABNORMAL HIGH (ref 11–307)

## 2022-03-08 LAB — CBC: RBC: 4.18 (ref 3.87–5.11)

## 2022-03-08 NOTE — Telephone Encounter (Signed)
Per 03/08/22 los next appt scheduled and confirmed with patient ?

## 2022-03-09 ENCOUNTER — Encounter: Payer: Self-pay | Admitting: Oncology

## 2022-03-11 DIAGNOSIS — J069 Acute upper respiratory infection, unspecified: Secondary | ICD-10-CM | POA: Diagnosis not present

## 2022-03-11 DIAGNOSIS — I10 Essential (primary) hypertension: Secondary | ICD-10-CM | POA: Diagnosis not present

## 2022-04-25 DIAGNOSIS — L209 Atopic dermatitis, unspecified: Secondary | ICD-10-CM | POA: Diagnosis not present

## 2022-04-25 DIAGNOSIS — L299 Pruritus, unspecified: Secondary | ICD-10-CM | POA: Diagnosis not present

## 2022-05-06 DIAGNOSIS — L209 Atopic dermatitis, unspecified: Secondary | ICD-10-CM | POA: Diagnosis not present

## 2022-05-12 DIAGNOSIS — I700 Atherosclerosis of aorta: Secondary | ICD-10-CM | POA: Diagnosis not present

## 2022-05-12 DIAGNOSIS — G5793 Unspecified mononeuropathy of bilateral lower limbs: Secondary | ICD-10-CM | POA: Diagnosis not present

## 2022-05-12 DIAGNOSIS — E785 Hyperlipidemia, unspecified: Secondary | ICD-10-CM | POA: Diagnosis not present

## 2022-05-12 DIAGNOSIS — E039 Hypothyroidism, unspecified: Secondary | ICD-10-CM | POA: Diagnosis not present

## 2022-05-12 DIAGNOSIS — I10 Essential (primary) hypertension: Secondary | ICD-10-CM | POA: Diagnosis not present

## 2022-05-12 DIAGNOSIS — D509 Iron deficiency anemia, unspecified: Secondary | ICD-10-CM | POA: Diagnosis not present

## 2022-05-12 DIAGNOSIS — E1149 Type 2 diabetes mellitus with other diabetic neurological complication: Secondary | ICD-10-CM | POA: Diagnosis not present

## 2022-05-12 DIAGNOSIS — M1990 Unspecified osteoarthritis, unspecified site: Secondary | ICD-10-CM | POA: Diagnosis not present

## 2022-06-27 IMAGING — MG DIGITAL SCREENING BILAT W/ TOMO W/ CAD
8 series · 8 of 24 positions shown · non-contrast
Comparison: Previous exam(s).

CLINICAL DATA: Screening.

EXAM:
DIGITAL SCREENING BILATERAL MAMMOGRAM WITH TOMO AND CAD

[L MLO synth-2D]
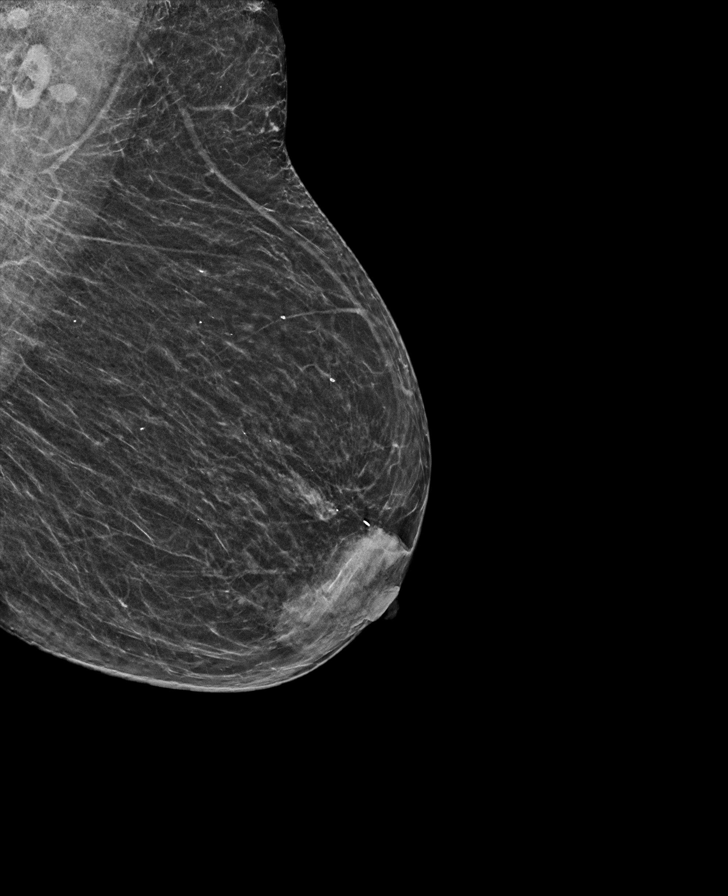

[R CC synth-2D]
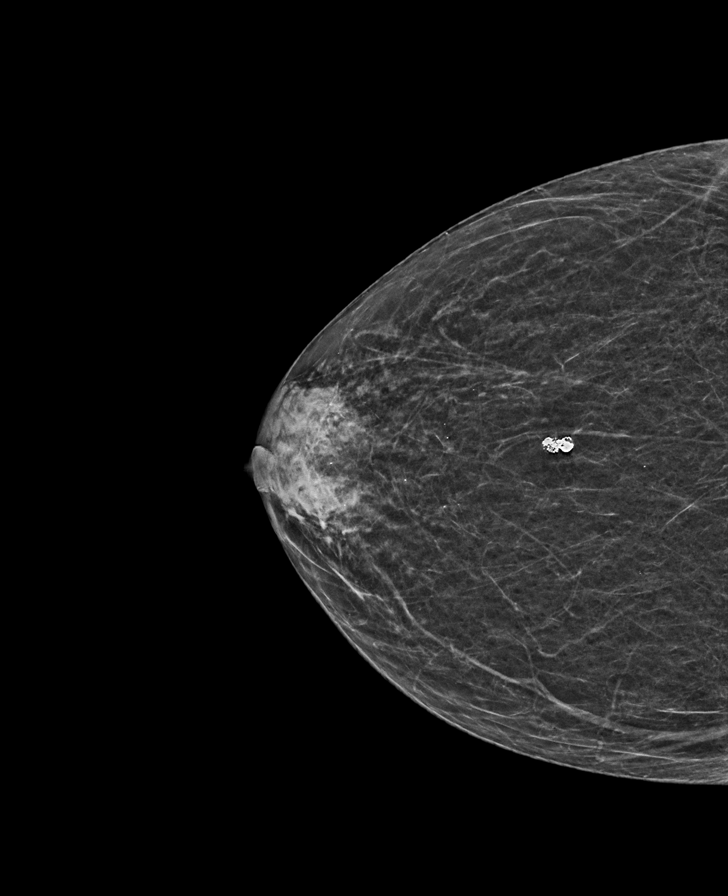

[L CC synth-2D]
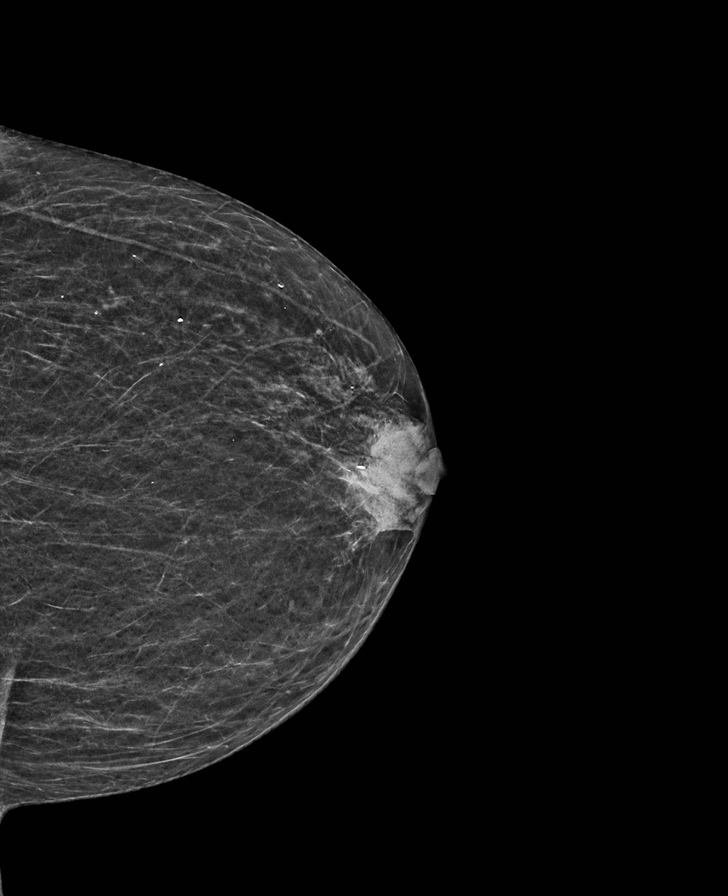

[R MLO synth-2D]
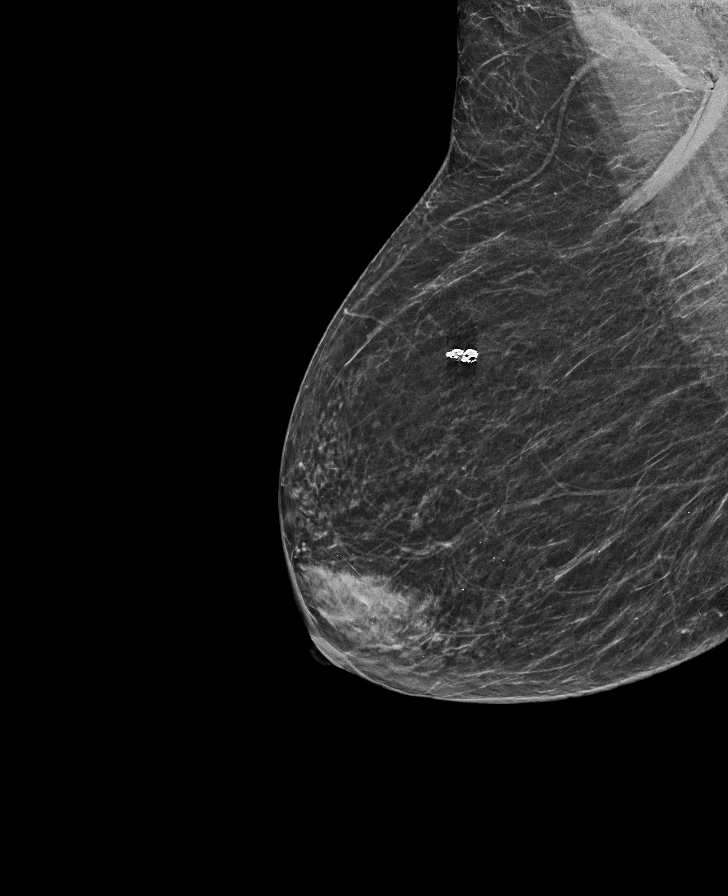

[L MLO tomo · tomo slice 25/48.0]
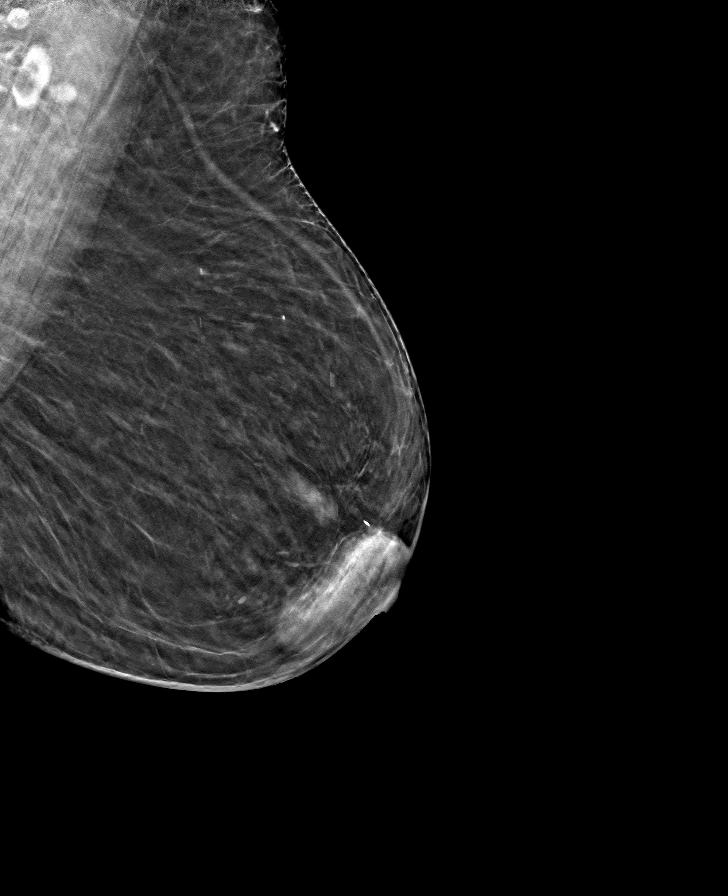

[L CC tomo · tomo slice 21/41.0]
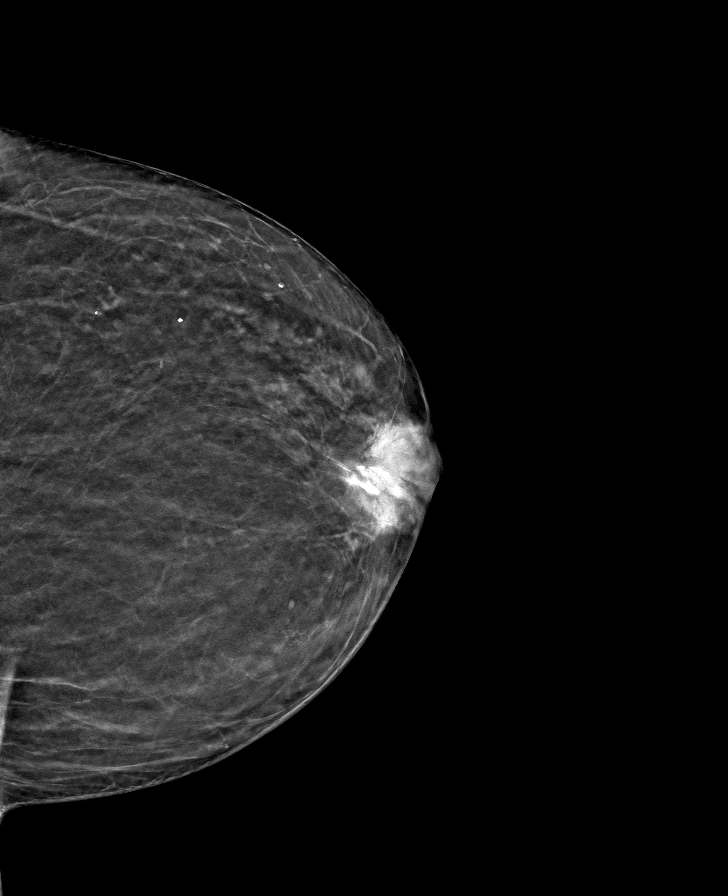

[R MLO tomo · tomo slice 25/50.0]
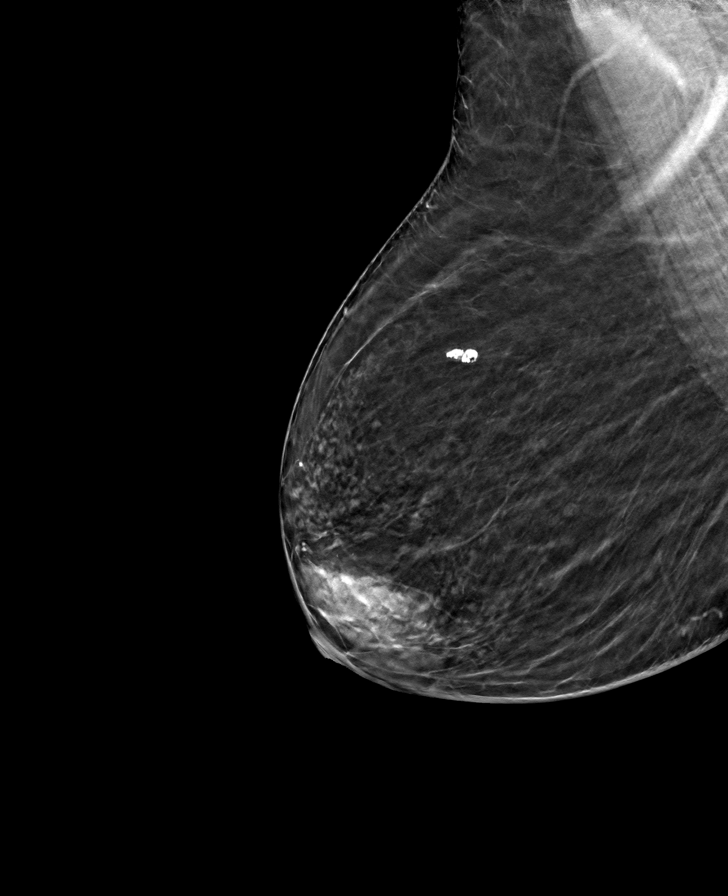

[R CC tomo · tomo slice 22/43.0]
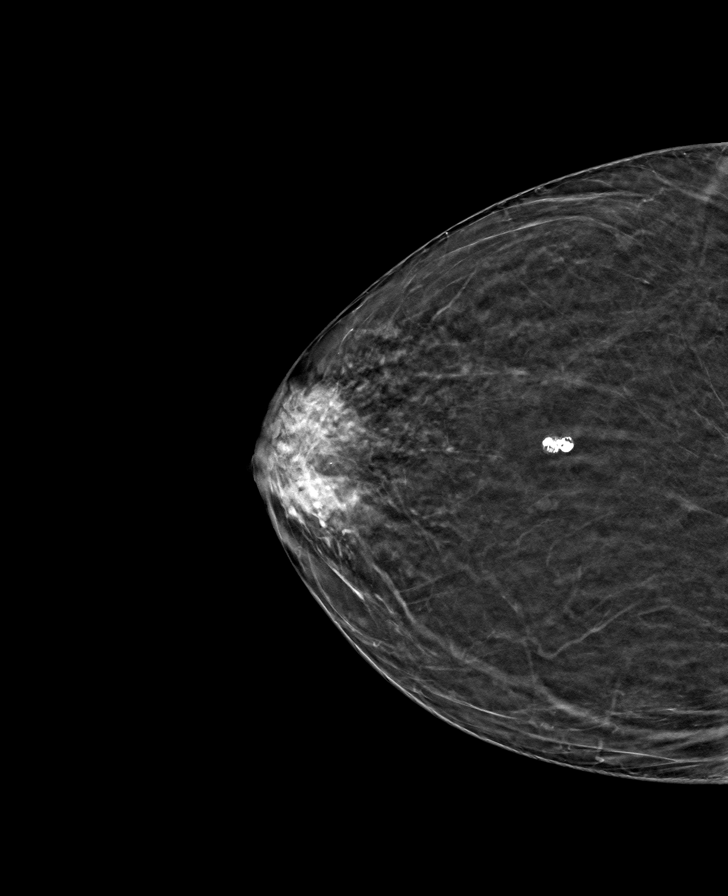

[8 of 24 positions shown; findings below may reference images not displayed]

ACR Breast Density Category b: There are scattered areas of
fibroglandular density.
FINDINGS: There are no findings suspicious for malignancy. Images were
processed with CAD.
IMPRESSION: No mammographic evidence of malignancy. A result letter of this
screening mammogram will be mailed directly to the patient.

RECOMMENDATION:
Screening mammogram in one year. (Code:CN-[id number])

BI-RADS CATEGORY  1: Negative.

## 2022-07-08 ENCOUNTER — Ambulatory Visit: Payer: Medicare Other | Admitting: Oncology

## 2022-07-08 ENCOUNTER — Other Ambulatory Visit: Payer: Medicare Other

## 2022-07-12 ENCOUNTER — Inpatient Hospital Stay: Payer: Medicare Other | Attending: Oncology

## 2022-07-12 ENCOUNTER — Inpatient Hospital Stay: Payer: Medicare Other | Admitting: Oncology

## 2022-07-12 ENCOUNTER — Telehealth: Payer: Self-pay | Admitting: Oncology

## 2022-07-12 VITALS — BP 223/93 | HR 69 | Temp 98.0°F | Resp 14 | Ht 62.0 in | Wt 140.3 lb

## 2022-07-12 DIAGNOSIS — D500 Iron deficiency anemia secondary to blood loss (chronic): Secondary | ICD-10-CM | POA: Diagnosis not present

## 2022-07-12 DIAGNOSIS — D509 Iron deficiency anemia, unspecified: Secondary | ICD-10-CM | POA: Diagnosis not present

## 2022-07-12 LAB — IRON AND TIBC
Iron: 103 ug/dL (ref 28–170)
Saturation Ratios: 31 % (ref 10.4–31.8)
TIBC: 332 ug/dL (ref 250–450)
UIBC: 229 ug/dL

## 2022-07-12 LAB — CBC AND DIFFERENTIAL
HCT: 32 — AB (ref 36–46)
Hemoglobin: 10.6 — AB (ref 12.0–16.0)
Neutrophils Absolute: 3.6
Platelets: 174 10*3/uL (ref 150–400)
WBC: 5.8

## 2022-07-12 LAB — BASIC METABOLIC PANEL
BUN: 26 — AB (ref 4–21)
CO2: 23 — AB (ref 13–22)
Chloride: 106 (ref 99–108)
Creatinine: 1.1 (ref 0.5–1.1)
Glucose: 108
Potassium: 4.4 mEq/L (ref 3.5–5.1)
Sodium: 138 (ref 137–147)

## 2022-07-12 LAB — FERRITIN: Ferritin: 376 ng/mL — ABNORMAL HIGH (ref 11–307)

## 2022-07-12 LAB — HEPATIC FUNCTION PANEL
ALT: 21 U/L (ref 7–35)
AST: 32 (ref 13–35)
Alkaline Phosphatase: 181 — AB (ref 25–125)
Bilirubin, Total: 0.6

## 2022-07-12 LAB — COMPREHENSIVE METABOLIC PANEL
Albumin: 4.2 (ref 3.5–5.0)
Calcium: 9.4 (ref 8.7–10.7)

## 2022-07-12 LAB — CBC: RBC: 3.72 — AB (ref 3.87–5.11)

## 2022-07-12 NOTE — Telephone Encounter (Signed)
07/12/22 spoke with patient and scheduled next appts.

## 2022-07-12 NOTE — Progress Notes (Signed)
[facility]  [address] [place],  [place]  [address] [phone]  Clinic Day:  07/12/2022  Referring physician: [name], NP  HISTORY OF PRESENT ILLNESS:  The patient is an 88 y.o. female  with iron deficiency anemia.  In the past, IV iron was effective in improving her iron and hemoglobin levels.  She comes in today for routine follow-up.  Since her last visit, the patient has been doing well.  She denies having increased fatigue or any overt forms of blood loss.    PHYSICAL EXAM:  Blood pressure (!) 223/93, pulse 69, temperature 98 F (36.7 C), resp. rate 14, height 5\' 2"  (1.575 m), weight 140 lb 4.8 oz (63.6 kg), SpO2 99 %. Wt Readings from Last 3 Encounters:  07/12/22 140 lb 4.8 oz (63.6 kg)  03/08/22 137 lb 6.4 oz (62.3 kg)  01/10/22 136 lb (61.7 kg)   Body mass index is 25.66 kg/m. Performance status (ECOG): 1 - Symptomatic but completely ambulatory Physical Exam Constitutional:      Appearance: Normal appearance. She is not ill-appearing.  HENT:     Mouth/Throat:     Mouth: Mucous membranes are moist.     Pharynx: Oropharynx is clear. No oropharyngeal exudate or posterior oropharyngeal erythema.  Cardiovascular:     Rate and Rhythm: Normal rate and regular rhythm.     Heart sounds: No murmur heard.    No friction rub. No gallop.  Pulmonary:     Effort: Pulmonary effort is normal. No respiratory distress.     Breath sounds: Normal breath sounds. No wheezing, rhonchi or rales.  Abdominal:     General: Bowel sounds are normal. There is no distension.     Palpations: Abdomen is soft. There is no mass.     Tenderness: There is no abdominal tenderness.  Musculoskeletal:        General: No swelling.     Right lower leg: No edema.     Left lower leg: No edema.  Lymphadenopathy:     Cervical: No cervical adenopathy.     Upper Body:     Right upper body: No supraclavicular or axillary adenopathy.     Left upper body: No  supraclavicular or axillary adenopathy.     Lower Body: No right inguinal adenopathy. No left inguinal adenopathy.  Skin:    General: Skin is warm.     Coloration: Skin is not jaundiced.     Findings: No lesion or rash.  Neurological:     General: No focal deficit present.     Mental Status: She is alert and oriented to person, place, and time. Mental status is at baseline.  Psychiatric:        Mood and Affect: Mood normal.        Behavior: Behavior normal.        Thought Content: Thought content normal.   LABS:     Latest Reference Range & Units 07/12/22 10:30  Iron 28 - 170 ug/dL 07/14/22  UIBC ug/dL 329  TIBC 518 - 841 ug/dL 660  Saturation Ratios 10.4 - 31.8 % 31  Ferritin 11 - 307 ng/mL 376 (H)  (H): Data is abnormally high  ASSESSMENT & PLAN:  An 88 y.o. female with iron deficiency anemia.  Her labs today do show that her hemoglobin is slightly lower than what it was previously.  However, her iron parameters today remain normal.  Clinically, the patient continues to do well.  For  now, her anemia will continue to be followed conservatively.I will see her back in 4 months for repeat clinical assessment.  The patient understands all the plans discussed today and is in agreement with them.  Shahab Polhamus Kirby Funk, MD

## 2022-08-16 DIAGNOSIS — E1149 Type 2 diabetes mellitus with other diabetic neurological complication: Secondary | ICD-10-CM | POA: Diagnosis not present

## 2022-08-16 DIAGNOSIS — E039 Hypothyroidism, unspecified: Secondary | ICD-10-CM | POA: Diagnosis not present

## 2022-08-16 DIAGNOSIS — E785 Hyperlipidemia, unspecified: Secondary | ICD-10-CM | POA: Diagnosis not present

## 2022-08-16 DIAGNOSIS — I700 Atherosclerosis of aorta: Secondary | ICD-10-CM | POA: Diagnosis not present

## 2022-08-16 DIAGNOSIS — D509 Iron deficiency anemia, unspecified: Secondary | ICD-10-CM | POA: Diagnosis not present

## 2022-08-16 DIAGNOSIS — M1990 Unspecified osteoarthritis, unspecified site: Secondary | ICD-10-CM | POA: Diagnosis not present

## 2022-08-16 DIAGNOSIS — I10 Essential (primary) hypertension: Secondary | ICD-10-CM | POA: Diagnosis not present

## 2022-08-16 DIAGNOSIS — G5793 Unspecified mononeuropathy of bilateral lower limbs: Secondary | ICD-10-CM | POA: Diagnosis not present

## 2022-10-03 DIAGNOSIS — J309 Allergic rhinitis, unspecified: Secondary | ICD-10-CM | POA: Diagnosis not present

## 2022-10-20 DIAGNOSIS — J209 Acute bronchitis, unspecified: Secondary | ICD-10-CM | POA: Diagnosis not present

## 2022-10-20 DIAGNOSIS — R21 Rash and other nonspecific skin eruption: Secondary | ICD-10-CM | POA: Diagnosis not present

## 2022-10-20 DIAGNOSIS — I10 Essential (primary) hypertension: Secondary | ICD-10-CM | POA: Diagnosis not present

## 2022-11-14 NOTE — Progress Notes (Signed)
[facility]  [address] [place],  [place]  [address] [phone]  Clinic Day:  07/12/2022  Referring physician: [name], NP  HISTORY OF PRESENT ILLNESS:  The patient is an 88 y.o. female  with iron deficiency anemia.  In the past, IV iron was effective in improving her iron and hemoglobin levels.  She comes in today for routine follow-up.  Since her last visit, the patient has been doing well.  She denies having increased fatigue or any overt forms of blood loss.    PHYSICAL EXAM:  There were no vitals taken for this visit. Wt Readings from Last 3 Encounters:  07/12/22 140 lb 4.8 oz (63.6 kg)  03/08/22 137 lb 6.4 oz (62.3 kg)  01/10/22 136 lb (61.7 kg)   There is no height or weight on file to calculate BMI. Performance status (ECOG): 1 - Symptomatic but completely ambulatory Physical Exam Constitutional:      Appearance: Normal appearance. She is not ill-appearing.  HENT:     Mouth/Throat:     Mouth: Mucous membranes are moist.     Pharynx: Oropharynx is clear. No oropharyngeal exudate or posterior oropharyngeal erythema.  Cardiovascular:     Rate and Rhythm: Normal rate and regular rhythm.     Heart sounds: No murmur heard.    No friction rub. No gallop.  Pulmonary:     Effort: Pulmonary effort is normal. No respiratory distress.     Breath sounds: Normal breath sounds. No wheezing, rhonchi or rales.  Abdominal:     General: Bowel sounds are normal. There is no distension.     Palpations: Abdomen is soft. There is no mass.     Tenderness: There is no abdominal tenderness.  Musculoskeletal:        General: No swelling.     Right lower leg: No edema.     Left lower leg: No edema.  Lymphadenopathy:     Cervical: No cervical adenopathy.     Upper Body:     Right upper body: No supraclavicular or axillary adenopathy.     Left upper body: No supraclavicular or axillary adenopathy.     Lower Body: No right inguinal adenopathy.  No left inguinal adenopathy.  Skin:    General: Skin is warm.     Coloration: Skin is not jaundiced.     Findings: No lesion or rash.  Neurological:     General: No focal deficit present.     Mental Status: She is alert and oriented to person, place, and time. Mental status is at baseline.  Psychiatric:        Mood and Affect: Mood normal.        Behavior: Behavior normal.        Thought Content: Thought content normal.   LABS:     Latest Reference Range & Units 07/12/22 10:30  Iron 28 - 170 ug/dL 160  UIBC ug/dL 109  TIBC 323 - 557 ug/dL 322  Saturation Ratios 10.4 - 31.8 % 31  Ferritin 11 - 307 ng/mL 376 (H)  (H): Data is abnormally high  ASSESSMENT & PLAN:  An 88 y.o. female with iron deficiency anemia.  Her labs today do show that her hemoglobin is slightly lower than what it was previously.  However, her iron parameters today remain normal.  Clinically, the patient continues to do well.   For  now, her anemia will continue to be followed conservatively.I will see her back  in 4 months for repeat clinical assessment.  The patient understands all the plans discussed today and is in agreement with them.  Shelsy Seng Kirby Funk, MD

## 2022-11-15 ENCOUNTER — Inpatient Hospital Stay: Payer: Medicare Other

## 2022-11-15 ENCOUNTER — Other Ambulatory Visit: Payer: Self-pay

## 2022-11-15 ENCOUNTER — Inpatient Hospital Stay: Payer: Medicare Other | Attending: Oncology | Admitting: Oncology

## 2022-11-15 ENCOUNTER — Other Ambulatory Visit: Payer: Self-pay | Admitting: Oncology

## 2022-11-15 ENCOUNTER — Telehealth: Payer: Self-pay | Admitting: Oncology

## 2022-11-15 VITALS — BP 179/81 | HR 75 | Temp 98.7°F | Resp 14 | Ht 62.0 in | Wt 138.5 lb

## 2022-11-15 DIAGNOSIS — D500 Iron deficiency anemia secondary to blood loss (chronic): Secondary | ICD-10-CM

## 2022-11-15 DIAGNOSIS — D509 Iron deficiency anemia, unspecified: Secondary | ICD-10-CM | POA: Diagnosis not present

## 2022-11-15 DIAGNOSIS — N289 Disorder of kidney and ureter, unspecified: Secondary | ICD-10-CM | POA: Diagnosis not present

## 2022-11-15 DIAGNOSIS — D649 Anemia, unspecified: Secondary | ICD-10-CM | POA: Diagnosis not present

## 2022-11-15 LAB — BASIC METABOLIC PANEL
BUN: 22 — AB (ref 4–21)
CO2: 20 (ref 13–22)
Chloride: 107 (ref 99–108)
Creatinine: 1.1 (ref 0.5–1.1)
Glucose: 127
Potassium: 4 mEq/L (ref 3.5–5.1)
Sodium: 134 — AB (ref 137–147)

## 2022-11-15 LAB — IRON AND TIBC
Iron: 70 ug/dL (ref 28–170)
Saturation Ratios: 22 % (ref 10.4–31.8)
TIBC: 318 ug/dL (ref 250–450)
UIBC: 248 ug/dL

## 2022-11-15 LAB — CBC AND DIFFERENTIAL
HCT: 32 — AB (ref 36–46)
Hemoglobin: 10.6 — AB (ref 12.0–16.0)
Neutrophils Absolute: 3.08
Platelets: 203 10*3/uL (ref 150–400)
WBC: 5.4

## 2022-11-15 LAB — HEPATIC FUNCTION PANEL
ALT: 19 U/L (ref 7–35)
AST: 27 (ref 13–35)
Alkaline Phosphatase: 188 — AB (ref 25–125)
Bilirubin, Total: 0.4

## 2022-11-15 LAB — FERRITIN: Ferritin: 272 ng/mL (ref 11–307)

## 2022-11-15 LAB — CBC: RBC: 3.89 (ref 3.87–5.11)

## 2022-11-15 LAB — COMPREHENSIVE METABOLIC PANEL
Albumin: 3.8 (ref 3.5–5.0)
Calcium: 9.4 (ref 8.7–10.7)

## 2022-11-15 NOTE — Telephone Encounter (Signed)
11/15/22 Next appt scheduled and confirmed with patient 

## 2022-11-22 DIAGNOSIS — Z9181 History of falling: Secondary | ICD-10-CM | POA: Diagnosis not present

## 2022-11-22 DIAGNOSIS — I700 Atherosclerosis of aorta: Secondary | ICD-10-CM | POA: Diagnosis not present

## 2022-11-22 DIAGNOSIS — E1149 Type 2 diabetes mellitus with other diabetic neurological complication: Secondary | ICD-10-CM | POA: Diagnosis not present

## 2022-11-22 DIAGNOSIS — I10 Essential (primary) hypertension: Secondary | ICD-10-CM | POA: Diagnosis not present

## 2022-11-22 DIAGNOSIS — J209 Acute bronchitis, unspecified: Secondary | ICD-10-CM | POA: Diagnosis not present

## 2022-11-22 DIAGNOSIS — E785 Hyperlipidemia, unspecified: Secondary | ICD-10-CM | POA: Diagnosis not present

## 2022-11-22 DIAGNOSIS — E039 Hypothyroidism, unspecified: Secondary | ICD-10-CM | POA: Diagnosis not present

## 2022-12-06 DIAGNOSIS — I10 Essential (primary) hypertension: Secondary | ICD-10-CM | POA: Diagnosis not present

## 2022-12-06 DIAGNOSIS — J209 Acute bronchitis, unspecified: Secondary | ICD-10-CM | POA: Diagnosis not present

## 2023-02-03 DIAGNOSIS — R109 Unspecified abdominal pain: Secondary | ICD-10-CM | POA: Diagnosis not present

## 2023-02-03 DIAGNOSIS — G5793 Unspecified mononeuropathy of bilateral lower limbs: Secondary | ICD-10-CM | POA: Diagnosis not present

## 2023-02-03 DIAGNOSIS — I10 Essential (primary) hypertension: Secondary | ICD-10-CM | POA: Diagnosis not present

## 2023-02-03 DIAGNOSIS — K625 Hemorrhage of anus and rectum: Secondary | ICD-10-CM | POA: Diagnosis not present

## 2023-02-03 DIAGNOSIS — Z1212 Encounter for screening for malignant neoplasm of rectum: Secondary | ICD-10-CM | POA: Diagnosis not present

## 2023-02-04 DIAGNOSIS — K5790 Diverticulosis of intestine, part unspecified, without perforation or abscess without bleeding: Secondary | ICD-10-CM | POA: Diagnosis not present

## 2023-02-04 DIAGNOSIS — Z79899 Other long term (current) drug therapy: Secondary | ICD-10-CM | POA: Diagnosis not present

## 2023-02-04 DIAGNOSIS — D649 Anemia, unspecified: Secondary | ICD-10-CM | POA: Diagnosis not present

## 2023-02-04 DIAGNOSIS — I10 Essential (primary) hypertension: Secondary | ICD-10-CM | POA: Diagnosis not present

## 2023-02-04 DIAGNOSIS — K625 Hemorrhage of anus and rectum: Secondary | ICD-10-CM | POA: Diagnosis not present

## 2023-02-04 DIAGNOSIS — R9431 Abnormal electrocardiogram [ECG] [EKG]: Secondary | ICD-10-CM | POA: Diagnosis not present

## 2023-02-04 DIAGNOSIS — Z880 Allergy status to penicillin: Secondary | ICD-10-CM | POA: Diagnosis not present

## 2023-02-04 DIAGNOSIS — M1990 Unspecified osteoarthritis, unspecified site: Secondary | ICD-10-CM | POA: Diagnosis not present

## 2023-02-04 DIAGNOSIS — E119 Type 2 diabetes mellitus without complications: Secondary | ICD-10-CM | POA: Diagnosis not present

## 2023-02-04 DIAGNOSIS — K921 Melena: Secondary | ICD-10-CM | POA: Diagnosis not present

## 2023-02-05 DIAGNOSIS — M1990 Unspecified osteoarthritis, unspecified site: Secondary | ICD-10-CM | POA: Diagnosis not present

## 2023-02-05 DIAGNOSIS — I10 Essential (primary) hypertension: Secondary | ICD-10-CM | POA: Diagnosis not present

## 2023-02-05 DIAGNOSIS — K921 Melena: Secondary | ICD-10-CM | POA: Diagnosis not present

## 2023-02-06 DIAGNOSIS — K5730 Diverticulosis of large intestine without perforation or abscess without bleeding: Secondary | ICD-10-CM | POA: Diagnosis not present

## 2023-02-06 DIAGNOSIS — E119 Type 2 diabetes mellitus without complications: Secondary | ICD-10-CM | POA: Diagnosis not present

## 2023-02-06 DIAGNOSIS — M1990 Unspecified osteoarthritis, unspecified site: Secondary | ICD-10-CM | POA: Diagnosis not present

## 2023-02-06 DIAGNOSIS — R935 Abnormal findings on diagnostic imaging of other abdominal regions, including retroperitoneum: Secondary | ICD-10-CM | POA: Diagnosis not present

## 2023-02-06 DIAGNOSIS — K5790 Diverticulosis of intestine, part unspecified, without perforation or abscess without bleeding: Secondary | ICD-10-CM | POA: Diagnosis not present

## 2023-02-06 DIAGNOSIS — I10 Essential (primary) hypertension: Secondary | ICD-10-CM | POA: Diagnosis not present

## 2023-02-06 DIAGNOSIS — K921 Melena: Secondary | ICD-10-CM | POA: Diagnosis not present

## 2023-02-06 DIAGNOSIS — N281 Cyst of kidney, acquired: Secondary | ICD-10-CM | POA: Diagnosis not present

## 2023-02-06 DIAGNOSIS — D509 Iron deficiency anemia, unspecified: Secondary | ICD-10-CM | POA: Diagnosis not present

## 2023-02-07 DIAGNOSIS — K625 Hemorrhage of anus and rectum: Secondary | ICD-10-CM | POA: Diagnosis not present

## 2023-02-07 DIAGNOSIS — I10 Essential (primary) hypertension: Secondary | ICD-10-CM | POA: Diagnosis not present

## 2023-02-07 DIAGNOSIS — Z79899 Other long term (current) drug therapy: Secondary | ICD-10-CM | POA: Diagnosis not present

## 2023-02-07 DIAGNOSIS — D509 Iron deficiency anemia, unspecified: Secondary | ICD-10-CM | POA: Diagnosis not present

## 2023-02-07 DIAGNOSIS — K5790 Diverticulosis of intestine, part unspecified, without perforation or abscess without bleeding: Secondary | ICD-10-CM | POA: Diagnosis not present

## 2023-02-10 DIAGNOSIS — Z139 Encounter for screening, unspecified: Secondary | ICD-10-CM | POA: Diagnosis not present

## 2023-02-10 DIAGNOSIS — I10 Essential (primary) hypertension: Secondary | ICD-10-CM | POA: Diagnosis not present

## 2023-02-21 DIAGNOSIS — E785 Hyperlipidemia, unspecified: Secondary | ICD-10-CM | POA: Diagnosis not present

## 2023-02-21 DIAGNOSIS — I10 Essential (primary) hypertension: Secondary | ICD-10-CM | POA: Diagnosis not present

## 2023-02-21 DIAGNOSIS — E1149 Type 2 diabetes mellitus with other diabetic neurological complication: Secondary | ICD-10-CM | POA: Diagnosis not present

## 2023-02-21 DIAGNOSIS — I700 Atherosclerosis of aorta: Secondary | ICD-10-CM | POA: Diagnosis not present

## 2023-03-15 NOTE — Progress Notes (Deleted)
[facility]  [address] [place],  [place]  [address] [phone]  Clinic Day:  11/15/2022  Referring physician: Lowella Dandy, NP  HISTORY OF PRESENT ILLNESS:  The patient is an 89 y.o. female  with iron deficiency anemia.  In the past, IV iron was effective in improving her iron and hemoglobin levels.  She comes in today for routine follow-up.  Since her last visit, the patient has been doing well.  She denies having increased fatigue or any overt forms of blood loss which concerns her for progressive anemia.  PHYSICAL EXAM:  There were no vitals taken for this visit. Wt Readings from Last 3 Encounters:  11/15/22 138 lb 8 oz (62.8 kg)  07/12/22 140 lb 4.8 oz (63.6 kg)  03/08/22 137 lb 6.4 oz (62.3 kg)   There is no height or weight on file to calculate BMI. Performance status (ECOG): 1 - Symptomatic but completely ambulatory Physical Exam Constitutional:      Appearance: Normal appearance. She is not ill-appearing.  HENT:     Mouth/Throat:     Mouth: Mucous membranes are moist.     Pharynx: Oropharynx is clear. No oropharyngeal exudate or posterior oropharyngeal erythema.  Cardiovascular:     Rate and Rhythm: Normal rate and regular rhythm.     Heart sounds: No murmur heard.    No friction rub. No gallop.  Pulmonary:     Effort: Pulmonary effort is normal. No respiratory distress.     Breath sounds: Normal breath sounds. No wheezing, rhonchi or rales.  Abdominal:     General: Bowel sounds are normal. There is no distension.     Palpations: Abdomen is soft. There is no mass.     Tenderness: There is no abdominal tenderness.  Musculoskeletal:        General: No swelling.     Right lower leg: No edema.     Left lower leg: No edema.  Lymphadenopathy:     Cervical: No cervical adenopathy.     Upper Body:     Right upper body: No supraclavicular or axillary adenopathy.     Left upper body: No supraclavicular or axillary adenopathy.      Lower Body: No right inguinal adenopathy. No left inguinal adenopathy.  Skin:    General: Skin is warm.     Coloration: Skin is not jaundiced.     Findings: No lesion or rash.  Neurological:     General: No focal deficit present.     Mental Status: She is alert and oriented to person, place, and time. Mental status is at baseline.  Psychiatric:        Mood and Affect: Mood normal.        Behavior: Behavior normal.        Thought Content: Thought content normal.    LABS:     Latest Reference Range & Units 11/15/22 10:23  Iron 28 - 170 ug/dL 70  UIBC ug/dL 248  TIBC 250 - 450 ug/dL 318  Saturation Ratios 10.4 - 31.8 % 22  Ferritin 11 - 307 ng/mL 272   ASSESSMENT & PLAN:  An 89 y.o. female with iron deficiency anemia.  Her labs today do show that her hemoglobin is the exact same as it was at her last visit.  Furthermore, her iron parameters today remain fine.  Of note, past labs have shown her with mild renal insufficiency, which could be factoring into her hemoglobin is not  completely normal.  However, as she is clinically doing well, her anemia will continue to be followed conservatively.  I will see her back in 4 months for repeat clinical assessment.  The patient understands all the plans discussed today and is in agreement with them.  Leiam Hopwood Macarthur Critchley, MD

## 2023-03-16 ENCOUNTER — Inpatient Hospital Stay: Payer: Medicare Other | Admitting: Oncology

## 2023-03-16 ENCOUNTER — Inpatient Hospital Stay: Payer: Medicare Other

## 2023-03-20 NOTE — Progress Notes (Signed)
[facility]  [address] [place],  [place]  [address] [phone]  Clinic Day:  03/21/2023  Referring physician: Lowella Dandy, NP  HISTORY OF PRESENT ILLNESS:  The patient is an 89 y.o. female  with iron deficiency anemia.  In the past, IV iron was effective in improving her iron and hemoglobin levels.  She comes in today for routine follow-up.  Since her last visit, the patient has been doing well.  She denies having increased fatigue or any overt forms of blood loss which concerns her for progressive anemia.  PHYSICAL EXAM:  Blood pressure (!) 210/100, pulse 78, temperature 98.5 F (36.9 C), resp. rate 16, height 5\' 2"  (1.575 m), weight 140 lb 9.6 oz (63.8 kg), SpO2 97 %. Wt Readings from Last 3 Encounters:  03/21/23 140 lb 9.6 oz (63.8 kg)  11/15/22 138 lb 8 oz (62.8 kg)  07/12/22 140 lb 4.8 oz (63.6 kg)   Body mass index is 25.72 kg/m. Performance status (ECOG): 1 - Symptomatic but completely ambulatory Physical Exam Constitutional:      Appearance: Normal appearance. She is not ill-appearing.  HENT:     Mouth/Throat:     Mouth: Mucous membranes are moist.     Pharynx: Oropharynx is clear. No oropharyngeal exudate or posterior oropharyngeal erythema.  Cardiovascular:     Rate and Rhythm: Normal rate and regular rhythm.     Heart sounds: No murmur heard.    No friction rub. No gallop.  Pulmonary:     Effort: Pulmonary effort is normal. No respiratory distress.     Breath sounds: Normal breath sounds. No wheezing, rhonchi or rales.  Abdominal:     General: Bowel sounds are normal. There is no distension.     Palpations: Abdomen is soft. There is no mass.     Tenderness: There is no abdominal tenderness.  Musculoskeletal:        General: No swelling.     Right lower leg: No edema.     Left lower leg: No edema.  Lymphadenopathy:     Cervical: No cervical adenopathy.     Upper Body:     Right upper body: No supraclavicular or  axillary adenopathy.     Left upper body: No supraclavicular or axillary adenopathy.     Lower Body: No right inguinal adenopathy. No left inguinal adenopathy.  Skin:    General: Skin is warm.     Coloration: Skin is not jaundiced.     Findings: No lesion or rash.  Neurological:     General: No focal deficit present.     Mental Status: She is alert and oriented to person, place, and time. Mental status is at baseline.  Psychiatric:        Mood and Affect: Mood normal.        Behavior: Behavior normal.        Thought Content: Thought content normal.   LABS:     Latest Reference Range & Units 03/21/23 10:32  Sodium 135 - 145 mmol/L 140  Potassium 3.5 - 5.1 mmol/L 3.9  Chloride 98 - 111 mmol/L 107  CO2 22 - 32 mmol/L 24  Glucose 70 - 99 mg/dL 141 (H)  BUN 8 - 23 mg/dL 30 (H)  Creatinine 0.44 - 1.00 mg/dL 1.22 (H)  Calcium 8.9 - 10.3 mg/dL 9.4  Anion gap 5 - 15  9  Alkaline Phosphatase 38 - 126 U/L 107  Albumin 3.5 - 5.0 g/dL  3.9  AST 15 - 41 U/L 20  ALT 0 - 44 U/L 15  Total Protein 6.5 - 8.1 g/dL 7.1  Total Bilirubin 0.3 - 1.2 mg/dL 0.4  GFR, Est Non African American >60 mL/min 43 (L)  (H): Data is abnormally high (L): Data is abnormally low   Latest Reference Range & Units 03/21/23 10:25  Iron 28 - 170 ug/dL 68  UIBC ug/dL 251  TIBC 250 - 450 ug/dL 319  Saturation Ratios 10.4 - 31.8 % 21  Ferritin 11 - 307 ng/mL 140   ASSESSMENT & PLAN:  An 89 y.o. female with iron deficiency anemia.  Her labs today do show that her hemoglobin is essentially unchanged versus her last visit.  Furthermore, despite her low MCV, her iron parameters today remain fine.  Of note, she does have mild renal insufficiency, which could be factoring into her hemoglobin not being completely normal.  However, as she is clinically doing well, her anemia will continue to be followed conservatively.  I will see her back in 4 months for repeat clinical assessment.  The patient understands all the plans  discussed today and is in agreement with them.  Daltin Crist Macarthur Critchley, MD

## 2023-03-21 ENCOUNTER — Inpatient Hospital Stay: Payer: Medicare Other | Admitting: Oncology

## 2023-03-21 ENCOUNTER — Inpatient Hospital Stay: Payer: Medicare Other | Attending: Oncology

## 2023-03-21 ENCOUNTER — Telehealth: Payer: Self-pay

## 2023-03-21 ENCOUNTER — Other Ambulatory Visit: Payer: Self-pay | Admitting: Oncology

## 2023-03-21 VITALS — BP 210/100 | HR 78 | Temp 98.5°F | Resp 16 | Ht 62.0 in | Wt 140.6 lb

## 2023-03-21 DIAGNOSIS — D500 Iron deficiency anemia secondary to blood loss (chronic): Secondary | ICD-10-CM

## 2023-03-21 DIAGNOSIS — D649 Anemia, unspecified: Secondary | ICD-10-CM | POA: Diagnosis not present

## 2023-03-21 DIAGNOSIS — D509 Iron deficiency anemia, unspecified: Secondary | ICD-10-CM | POA: Insufficient documentation

## 2023-03-21 LAB — CMP (CANCER CENTER ONLY)
ALT: 15 U/L (ref 0–44)
AST: 20 U/L (ref 15–41)
Albumin: 3.9 g/dL (ref 3.5–5.0)
Alkaline Phosphatase: 107 U/L (ref 38–126)
Anion gap: 9 (ref 5–15)
BUN: 30 mg/dL — ABNORMAL HIGH (ref 8–23)
CO2: 24 mmol/L (ref 22–32)
Calcium: 9.4 mg/dL (ref 8.9–10.3)
Chloride: 107 mmol/L (ref 98–111)
Creatinine: 1.22 mg/dL — ABNORMAL HIGH (ref 0.44–1.00)
GFR, Estimated: 43 mL/min — ABNORMAL LOW (ref >60)
Glucose, Bld: 141 mg/dL — ABNORMAL HIGH (ref 70–99)
Potassium: 3.9 mmol/L (ref 3.5–5.1)
Sodium: 140 mmol/L (ref 135–145)
Total Bilirubin: 0.4 mg/dL (ref 0.3–1.2)
Total Protein: 7.1 g/dL (ref 6.5–8.1)

## 2023-03-21 LAB — FERRITIN: Ferritin: 140 ng/mL (ref 11–307)

## 2023-03-21 LAB — IRON AND TIBC
Iron: 68 ug/dL (ref 28–170)
Saturation Ratios: 21 % (ref 10.4–31.8)
TIBC: 319 ug/dL (ref 250–450)
UIBC: 251 ug/dL

## 2023-03-21 LAB — CBC AND DIFFERENTIAL
HCT: 33 — AB (ref 36–46)
Hemoglobin: 10.7 — AB (ref 12.0–16.0)
Neutrophils Absolute: 3.16
Platelets: 216 10*3/uL (ref 150–400)
WBC: 5.1

## 2023-03-21 LAB — CBC: RBC: 4.09 (ref 3.87–5.11)

## 2023-03-21 NOTE — Telephone Encounter (Signed)
ASSESSMENT & PLAN:  An 89 y.o. female with iron deficiency anemia.  Her labs today do show that her hemoglobin is essentially unchanged versus her last visit.  Furthermore, despite her low MCV, her iron parameters today remain fine.  Of note, she does have mild renal insufficiency, which could be factoring into her hemoglobin not being completely normal.  However, as she is clinically doing well, her anemia will continue to be followed conservatively.  I will see her back in 4 months for repeat clinical assessment.  The patient understands all the plans discussed today and is in agreement with them.   Marice Potter, MD    Latest Reference Range & Units 03/21/23 10:25  Iron 28 - 170 ug/dL 68  UIBC ug/dL 251  TIBC 250 - 450 ug/dL 319  Saturation Ratios 10.4 - 31.8 % 21  Ferritin 11 - 307 ng/mL 140

## 2023-04-07 DIAGNOSIS — L209 Atopic dermatitis, unspecified: Secondary | ICD-10-CM | POA: Diagnosis not present

## 2023-04-07 DIAGNOSIS — L299 Pruritus, unspecified: Secondary | ICD-10-CM | POA: Diagnosis not present

## 2023-04-21 DIAGNOSIS — L810 Postinflammatory hyperpigmentation: Secondary | ICD-10-CM | POA: Diagnosis not present

## 2023-04-21 DIAGNOSIS — L209 Atopic dermatitis, unspecified: Secondary | ICD-10-CM | POA: Diagnosis not present

## 2023-05-08 DIAGNOSIS — E119 Type 2 diabetes mellitus without complications: Secondary | ICD-10-CM | POA: Diagnosis not present

## 2023-05-08 DIAGNOSIS — M2042 Other hammer toe(s) (acquired), left foot: Secondary | ICD-10-CM | POA: Diagnosis not present

## 2023-05-08 DIAGNOSIS — L603 Nail dystrophy: Secondary | ICD-10-CM | POA: Diagnosis not present

## 2023-05-08 DIAGNOSIS — M2041 Other hammer toe(s) (acquired), right foot: Secondary | ICD-10-CM | POA: Diagnosis not present

## 2023-05-25 DIAGNOSIS — E1149 Type 2 diabetes mellitus with other diabetic neurological complication: Secondary | ICD-10-CM | POA: Diagnosis not present

## 2023-05-25 DIAGNOSIS — M1990 Unspecified osteoarthritis, unspecified site: Secondary | ICD-10-CM | POA: Diagnosis not present

## 2023-05-25 DIAGNOSIS — I700 Atherosclerosis of aorta: Secondary | ICD-10-CM | POA: Diagnosis not present

## 2023-05-25 DIAGNOSIS — E785 Hyperlipidemia, unspecified: Secondary | ICD-10-CM | POA: Diagnosis not present

## 2023-05-25 DIAGNOSIS — E039 Hypothyroidism, unspecified: Secondary | ICD-10-CM | POA: Diagnosis not present

## 2023-05-25 DIAGNOSIS — R634 Abnormal weight loss: Secondary | ICD-10-CM | POA: Diagnosis not present

## 2023-05-25 DIAGNOSIS — G5793 Unspecified mononeuropathy of bilateral lower limbs: Secondary | ICD-10-CM | POA: Diagnosis not present

## 2023-05-25 DIAGNOSIS — I10 Essential (primary) hypertension: Secondary | ICD-10-CM | POA: Diagnosis not present

## 2023-07-21 ENCOUNTER — Ambulatory Visit: Payer: Medicare Other | Admitting: Oncology

## 2023-07-21 ENCOUNTER — Other Ambulatory Visit: Payer: Medicare Other

## 2023-07-25 NOTE — Progress Notes (Unsigned)
[facility]  [address] [place],  [place]  [address] [phone]  Clinic Day:  07/26/2023  Referring physician: [name], NP  HISTORY OF PRESENT ILLNESS:  The patient is an 89 y.o. female  with iron deficiency anemia.  In the past, IV iron was effective in improving her iron and hemoglobin levels.  She comes in today for routine follow-up.  Since her last visit, the patient has been doing okay.  She has had fatigue recently.  She denies having any overt forms of blood loss which concern her for progressive anemia.  PHYSICAL EXAM:  Blood pressure (!) 189/82, pulse 60, temperature 98.4 F (36.9 C), resp. rate 14, height 5\' 2"  (1.575 m), weight 133 lb 14.4 oz (60.7 kg), SpO2 100%. Wt Readings from Last 3 Encounters:  07/26/23 133 lb 14.4 oz (60.7 kg)  03/21/23 140 lb 9.6 oz (63.8 kg)  11/15/22 138 lb 8 oz (62.8 kg)   Body mass index is 24.49 kg/m. Performance status (ECOG): 1 - Symptomatic but completely ambulatory Physical Exam Constitutional:      Appearance: Normal appearance. She is not ill-appearing.  HENT:     Mouth/Throat:     Mouth: Mucous membranes are moist.     Pharynx: Oropharynx is clear. No oropharyngeal exudate or posterior oropharyngeal erythema.  Cardiovascular:     Rate and Rhythm: Normal rate and regular rhythm.     Heart sounds: No murmur heard.    No friction rub. No gallop.  Pulmonary:     Effort: Pulmonary effort is normal. No respiratory distress.     Breath sounds: Normal breath sounds. No wheezing, rhonchi or rales.  Abdominal:     General: Bowel sounds are normal. There is no distension.     Palpations: Abdomen is soft. There is no mass.     Tenderness: There is no abdominal tenderness.  Musculoskeletal:        General: No swelling.     Right lower leg: No edema.     Left lower leg: No edema.  Lymphadenopathy:     Cervical: No cervical adenopathy.     Upper Body:     Right upper body: No  supraclavicular or axillary adenopathy.     Left upper body: No supraclavicular or axillary adenopathy.     Lower Body: No right inguinal adenopathy. No left inguinal adenopathy.  Skin:    General: Skin is warm.     Coloration: Skin is not jaundiced.     Findings: No lesion or rash.  Neurological:     General: No focal deficit present.     Mental Status: She is alert and oriented to person, place, and time. Mental status is at baseline.  Psychiatric:        Mood and Affect: Mood normal.        Behavior: Behavior normal.        Thought Content: Thought content normal.    LABS:    Latest Reference Range & Units 07/26/23 00:00  WBC  4.8 (E)  RBC 3.87 - 5.11  3.81 ! (E)  Hemoglobin 12.0 - 16.0  10.1 ! (E)  HCT 36 - 46  30 ! (E)  Platelets 150 - 400 K/uL 205 (E)  !: Data is abnormal (E): External lab result  Latest Reference Range & Units 07/26/23 09:56  Sodium 135 - 145 mmol/L 137  Potassium 3.5 - 5.1 mmol/L 3.7  Chloride 98 - 111 mmol/L 106  CO2 22 - 32 mmol/L 23  Glucose 70 - 99 mg/dL 161 (H)  BUN 8 - 23 mg/dL 23  Creatinine 0.96 - 0.45 mg/dL 4.09 (H)  Calcium 8.9 - 10.3 mg/dL 9.4  Anion gap 5 - 15  8  Alkaline Phosphatase 38 - 126 U/L 122  Albumin 3.5 - 5.0 g/dL 3.8  AST 15 - 41 U/L 22  ALT 0 - 44 U/L 14  Total Protein 6.5 - 8.1 g/dL 7.0  Total Bilirubin 0.3 - 1.2 mg/dL 0.5  GFR, Est Non African American >60 mL/min 54 (L)  Iron 28 - 170 ug/dL 70  UIBC ug/dL 811  TIBC 914 - 782 ug/dL 956  Saturation Ratios 10.4 - 31.8 % 24  Ferritin 11 - 307 ng/mL 144  (H): Data is abnormally high (L): Data is abnormally low ASSESSMENT & PLAN:  An 89 y.o. female with iron deficiency anemia.  Her labs today do show that her hemoglobin is lower than what it was previously.  However, there is no evidence of any iron deficiency.  As her hemoglobin remains above 10, she will continue to be followed conservatively.  If her hemoglobin falls below 10 and there are no other possible etiologies  behind this, the patient may ultimately need a bone marrow biopsy to ensure there is no intrinsic bone marrow disease factoring into her anemia.  I will see her back in 3 months for repeat clinical assessment.  The patient understands all the plans discussed today and is in agreement with them.   Kirby Funk, MD

## 2023-07-26 ENCOUNTER — Inpatient Hospital Stay (INDEPENDENT_AMBULATORY_CARE_PROVIDER_SITE_OTHER): Payer: Medicare Other | Admitting: Oncology

## 2023-07-26 ENCOUNTER — Inpatient Hospital Stay: Payer: Medicare Other | Attending: Oncology

## 2023-07-26 ENCOUNTER — Other Ambulatory Visit: Payer: Self-pay | Admitting: Oncology

## 2023-07-26 VITALS — BP 189/82 | HR 60 | Temp 98.4°F | Resp 14 | Ht 62.0 in | Wt 133.9 lb

## 2023-07-26 DIAGNOSIS — D500 Iron deficiency anemia secondary to blood loss (chronic): Secondary | ICD-10-CM

## 2023-07-26 DIAGNOSIS — D509 Iron deficiency anemia, unspecified: Secondary | ICD-10-CM | POA: Insufficient documentation

## 2023-07-26 DIAGNOSIS — D649 Anemia, unspecified: Secondary | ICD-10-CM | POA: Diagnosis not present

## 2023-07-26 LAB — CMP (CANCER CENTER ONLY)
ALT: 14 U/L (ref 0–44)
AST: 22 U/L (ref 15–41)
Albumin: 3.8 g/dL (ref 3.5–5.0)
Alkaline Phosphatase: 122 U/L (ref 38–126)
Anion gap: 8 (ref 5–15)
BUN: 23 mg/dL (ref 8–23)
CO2: 23 mmol/L (ref 22–32)
Calcium: 9.4 mg/dL (ref 8.9–10.3)
Chloride: 106 mmol/L (ref 98–111)
Creatinine: 1.01 mg/dL — ABNORMAL HIGH (ref 0.44–1.00)
GFR, Estimated: 54 mL/min — ABNORMAL LOW (ref >60)
Glucose, Bld: 142 mg/dL — ABNORMAL HIGH (ref 70–99)
Potassium: 3.7 mmol/L (ref 3.5–5.1)
Sodium: 137 mmol/L (ref 135–145)
Total Bilirubin: 0.5 mg/dL (ref 0.3–1.2)
Total Protein: 7 g/dL (ref 6.5–8.1)

## 2023-07-26 LAB — FERRITIN: Ferritin: 144 ng/mL (ref 11–307)

## 2023-07-26 LAB — CBC: RBC: 3.81 — AB (ref 3.87–5.11)

## 2023-07-26 LAB — CBC AND DIFFERENTIAL
HCT: 30 — AB (ref 36–46)
Hemoglobin: 10.1 — AB (ref 12.0–16.0)
Neutrophils Absolute: 2.4
Platelets: 205 10*3/uL (ref 150–400)
WBC: 4.8

## 2023-07-26 LAB — IRON AND TIBC
Iron: 70 ug/dL (ref 28–170)
Saturation Ratios: 24 % (ref 10.4–31.8)
TIBC: 297 ug/dL (ref 250–450)
UIBC: 227 ug/dL

## 2023-07-27 ENCOUNTER — Telehealth: Payer: Self-pay | Admitting: Oncology

## 2023-07-27 ENCOUNTER — Telehealth: Payer: Self-pay

## 2023-07-27 NOTE — Telephone Encounter (Signed)
Patient has been scheduled. Aware of appt date and time   Scheduling Message Entered by Rennis Harding A on 07/26/2023 at  4:26 PM Priority: Routine <No visit type provided>  Department: [facility]  Provider:  Scheduling Notes:  Labs/appt 10-26-23

## 2023-07-27 NOTE — Telephone Encounter (Signed)
Latest Reference Range & Units 07/26/23 09:56  Iron 28 - 170 ug/dL 70  UIBC ug/dL 782  TIBC 956 - 213 ug/dL 086  Saturation Ratios 10.4 - 31.8 % 24  Ferritin 11 - 307 ng/mL 144    ASSESSMENT & PLAN:  An 89 y.o. female with iron deficiency anemia.  Her labs today do show that her hemoglobin is lower than what it was previously.  However, there is no evidence of any iron deficiency.  As her hemoglobin remains above 10, she will continue to be followed conservatively.  If her hemoglobin falls below 10 and there are no other possible etiologies behind this, the patient may ultimately need a bone marrow biopsy to ensure there is no intrinsic bone marrow disease factoring into her anemia.  I will see her back in 3 months for repeat clinical assessment.  The patient understands all the plans discussed today and is in agreement with them.   Weston Settle, MD                Electronically signed by Weston Settle, MD at 07/26/2023  4:31 PM

## 2023-09-04 DIAGNOSIS — I700 Atherosclerosis of aorta: Secondary | ICD-10-CM | POA: Diagnosis not present

## 2023-09-04 DIAGNOSIS — I10 Essential (primary) hypertension: Secondary | ICD-10-CM | POA: Diagnosis not present

## 2023-09-04 DIAGNOSIS — E1149 Type 2 diabetes mellitus with other diabetic neurological complication: Secondary | ICD-10-CM | POA: Diagnosis not present

## 2023-09-04 DIAGNOSIS — M1990 Unspecified osteoarthritis, unspecified site: Secondary | ICD-10-CM | POA: Diagnosis not present

## 2023-09-04 DIAGNOSIS — R634 Abnormal weight loss: Secondary | ICD-10-CM | POA: Diagnosis not present

## 2023-10-25 NOTE — Progress Notes (Deleted)
[facility]  [address] [place],  [place]  [address] [phone]  Clinic Day:  07/26/2023  Referring physician: [name], NP  HISTORY OF PRESENT ILLNESS:  The patient is an 89 y.o. female  with iron deficiency anemia.  In the past, IV iron was effective in improving her iron and hemoglobin levels.  She comes in today for routine follow-up.  Since her last visit, the patient has been doing okay.  She has had fatigue recently.  She denies having any overt forms of blood loss which concern her for progressive anemia.  PHYSICAL EXAM:  There were no vitals taken for this visit. Wt Readings from Last 3 Encounters:  07/26/23 133 lb 14.4 oz (60.7 kg)  03/21/23 140 lb 9.6 oz (63.8 kg)  11/15/22 138 lb 8 oz (62.8 kg)   There is no height or weight on file to calculate BMI. Performance status (ECOG): 1 - Symptomatic but completely ambulatory Physical Exam Constitutional:      Appearance: Normal appearance. She is not ill-appearing.  HENT:     Mouth/Throat:     Mouth: Mucous membranes are moist.     Pharynx: Oropharynx is clear. No oropharyngeal exudate or posterior oropharyngeal erythema.  Cardiovascular:     Rate and Rhythm: Normal rate and regular rhythm.     Heart sounds: No murmur heard.    No friction rub. No gallop.  Pulmonary:     Effort: Pulmonary effort is normal. No respiratory distress.     Breath sounds: Normal breath sounds. No wheezing, rhonchi or rales.  Abdominal:     General: Bowel sounds are normal. There is no distension.     Palpations: Abdomen is soft. There is no mass.     Tenderness: There is no abdominal tenderness.  Musculoskeletal:        General: No swelling.     Right lower leg: No edema.     Left lower leg: No edema.  Lymphadenopathy:     Cervical: No cervical adenopathy.     Upper Body:     Right upper body: No supraclavicular or axillary adenopathy.     Left upper body: No supraclavicular or axillary  adenopathy.     Lower Body: No right inguinal adenopathy. No left inguinal adenopathy.  Skin:    General: Skin is warm.     Coloration: Skin is not jaundiced.     Findings: No lesion or rash.  Neurological:     General: No focal deficit present.     Mental Status: She is alert and oriented to person, place, and time. Mental status is at baseline.  Psychiatric:        Mood and Affect: Mood normal.        Behavior: Behavior normal.        Thought Content: Thought content normal.   LABS:    Latest Reference Range & Units 07/26/23 00:00  WBC  4.8 (E)  RBC 3.87 - 5.11  3.81 ! (E)  Hemoglobin 12.0 - 16.0  10.1 ! (E)  HCT 36 - 46  30 ! (E)  Platelets 150 - 400 K/uL 205 (E)  !: Data is abnormal (E): External lab result  Latest Reference Range & Units 07/26/23 09:56  Sodium 135 - 145 mmol/L 137  Potassium 3.5 - 5.1 mmol/L 3.7  Chloride 98 - 111 mmol/L 106  CO2 22 - 32 mmol/L 23  Glucose 70 - 99 mg/dL 401 (H)  BUN  8 - 23 mg/dL 23  Creatinine 1.61 - 0.96 mg/dL 0.45 (H)  Calcium 8.9 - 10.3 mg/dL 9.4  Anion gap 5 - 15  8  Alkaline Phosphatase 38 - 126 U/L 122  Albumin 3.5 - 5.0 g/dL 3.8  AST 15 - 41 U/L 22  ALT 0 - 44 U/L 14  Total Protein 6.5 - 8.1 g/dL 7.0  Total Bilirubin 0.3 - 1.2 mg/dL 0.5  GFR, Est Non African American >60 mL/min 54 (L)  Iron 28 - 170 ug/dL 70  UIBC ug/dL 409  TIBC 811 - 914 ug/dL 782  Saturation Ratios 10.4 - 31.8 % 24  Ferritin 11 - 307 ng/mL 144  (H): Data is abnormally high (L): Data is abnormally low ASSESSMENT & PLAN:  An 89 y.o. female with iron deficiency anemia.  Her labs today do show that her hemoglobin is lower than what it was previously.  However, there is no evidence of any iron deficiency.  As her hemoglobin remains above 10, she will continue to be followed conservatively.  If her hemoglobin falls below 10 and there are no other possible etiologies behind this, the patient may ultimately need a bone marrow biopsy to ensure there is no  intrinsic bone marrow disease factoring into her anemia.  I will see her back in 3 months for repeat clinical assessment.  The patient understands all the plans discussed today and is in agreement with them.  Michelle Mcenery Kirby Funk, MD

## 2023-10-26 ENCOUNTER — Inpatient Hospital Stay: Payer: Medicare Other

## 2023-10-26 ENCOUNTER — Inpatient Hospital Stay: Payer: Medicare Other | Admitting: Oncology

## 2023-11-01 NOTE — Progress Notes (Unsigned)
 [facility]  [address] [place],  [place]  [address] [phone]  Clinic Day:  07/26/2023  Referring physician: [name], NP  HISTORY OF PRESENT ILLNESS:  The patient is an 89 y.o. female  with iron deficiency anemia.  In the past, IV iron was effective in improving her iron and hemoglobin levels.  She comes in today for routine follow-up.  Since her last visit, the patient has been doing okay.  She has had fatigue recently.  She denies having any overt forms of blood loss which concern her for progressive anemia.  PHYSICAL EXAM:  There were no vitals taken for this visit. Wt Readings from Last 3 Encounters:  07/26/23 133 lb 14.4 oz (60.7 kg)  03/21/23 140 lb 9.6 oz (63.8 kg)  11/15/22 138 lb 8 oz (62.8 kg)   There is no height or weight on file to calculate BMI. Performance status (ECOG): 1 - Symptomatic but completely ambulatory Physical Exam Constitutional:      Appearance: Normal appearance. She is not ill-appearing.  HENT:     Mouth/Throat:     Mouth: Mucous membranes are moist.     Pharynx: Oropharynx is clear. No oropharyngeal exudate or posterior oropharyngeal erythema.  Cardiovascular:     Rate and Rhythm: Normal rate and regular rhythm.     Heart sounds: No murmur heard.    No friction rub. No gallop.  Pulmonary:     Effort: Pulmonary effort is normal. No respiratory distress.     Breath sounds: Normal breath sounds. No wheezing, rhonchi or rales.  Abdominal:     General: Bowel sounds are normal. There is no distension.     Palpations: Abdomen is soft. There is no mass.     Tenderness: There is no abdominal tenderness.  Musculoskeletal:        General: No swelling.     Right lower leg: No edema.     Left lower leg: No edema.  Lymphadenopathy:     Cervical: No cervical adenopathy.     Upper Body:     Right upper body: No supraclavicular or axillary adenopathy.     Left upper body: No supraclavicular or axillary  adenopathy.     Lower Body: No right inguinal adenopathy. No left inguinal adenopathy.  Skin:    General: Skin is warm.     Coloration: Skin is not jaundiced.     Findings: No lesion or rash.  Neurological:     General: No focal deficit present.     Mental Status: She is alert and oriented to person, place, and time. Mental status is at baseline.  Psychiatric:        Mood and Affect: Mood normal.        Behavior: Behavior normal.        Thought Content: Thought content normal.   LABS:    Latest Reference Range & Units 07/26/23 00:00  WBC  4.8 (E)  RBC 3.87 - 5.11  3.81 ! (E)  Hemoglobin 12.0 - 16.0  10.1 ! (E)  HCT 36 - 46  30 ! (E)  Platelets 150 - 400 K/uL 205 (E)  !: Data is abnormal (E): External lab result  Latest Reference Range & Units 07/26/23 09:56  Sodium 135 - 145 mmol/L 137  Potassium 3.5 - 5.1 mmol/L 3.7  Chloride 98 - 111 mmol/L 106  CO2 22 - 32 mmol/L 23  Glucose 70 - 99 mg/dL 401 (H)  BUN  8 - 23 mg/dL 23  Creatinine 1.61 - 0.96 mg/dL 0.45 (H)  Calcium 8.9 - 10.3 mg/dL 9.4  Anion gap 5 - 15  8  Alkaline Phosphatase 38 - 126 U/L 122  Albumin 3.5 - 5.0 g/dL 3.8  AST 15 - 41 U/L 22  ALT 0 - 44 U/L 14  Total Protein 6.5 - 8.1 g/dL 7.0  Total Bilirubin 0.3 - 1.2 mg/dL 0.5  GFR, Est Non African American >60 mL/min 54 (L)  Iron 28 - 170 ug/dL 70  UIBC ug/dL 409  TIBC 811 - 914 ug/dL 782  Saturation Ratios 10.4 - 31.8 % 24  Ferritin 11 - 307 ng/mL 144  (H): Data is abnormally high (L): Data is abnormally low ASSESSMENT & PLAN:  An 89 y.o. female with iron deficiency anemia.  Her labs today do show that her hemoglobin is lower than what it was previously.  However, there is no evidence of any iron deficiency.  As her hemoglobin remains above 10, she will continue to be followed conservatively.  If her hemoglobin falls below 10 and there are no other possible etiologies behind this, the patient may ultimately need a bone marrow biopsy to ensure there is no  intrinsic bone marrow disease factoring into her anemia.  I will see her back in 3 months for repeat clinical assessment.  The patient understands all the plans discussed today and is in agreement with them.  Briea Mcenery Kirby Funk, MD

## 2023-11-02 ENCOUNTER — Inpatient Hospital Stay: Payer: Medicare Other | Attending: Oncology

## 2023-11-02 ENCOUNTER — Other Ambulatory Visit: Payer: Self-pay | Admitting: Oncology

## 2023-11-02 ENCOUNTER — Inpatient Hospital Stay: Payer: Medicare Other | Admitting: Oncology

## 2023-11-02 ENCOUNTER — Telehealth: Payer: Self-pay | Admitting: Oncology

## 2023-11-02 VITALS — BP 211/66 | HR 64 | Temp 98.6°F | Resp 16 | Ht 62.0 in | Wt 133.8 lb

## 2023-11-02 DIAGNOSIS — D508 Other iron deficiency anemias: Secondary | ICD-10-CM

## 2023-11-02 DIAGNOSIS — D500 Iron deficiency anemia secondary to blood loss (chronic): Secondary | ICD-10-CM | POA: Diagnosis not present

## 2023-11-02 DIAGNOSIS — D509 Iron deficiency anemia, unspecified: Secondary | ICD-10-CM | POA: Diagnosis not present

## 2023-11-02 LAB — CBC WITH DIFFERENTIAL (CANCER CENTER ONLY)
Abs Immature Granulocytes: 0.03 10*3/uL (ref 0.00–0.07)
Basophils Absolute: 0 10*3/uL (ref 0.0–0.1)
Basophils Relative: 0 %
Eosinophils Absolute: 0.2 10*3/uL (ref 0.0–0.5)
Eosinophils Relative: 3 %
HCT: 30.6 % — ABNORMAL LOW (ref 36.0–46.0)
Hemoglobin: 10 g/dL — ABNORMAL LOW (ref 12.0–15.0)
Immature Granulocytes: 1 %
Lymphocytes Relative: 22 %
Lymphs Abs: 1.4 10*3/uL (ref 0.7–4.0)
MCH: 26.6 pg (ref 26.0–34.0)
MCHC: 32.7 g/dL (ref 30.0–36.0)
MCV: 81.4 fL (ref 80.0–100.0)
Monocytes Absolute: 0.7 10*3/uL (ref 0.1–1.0)
Monocytes Relative: 11 %
Neutro Abs: 4.1 10*3/uL (ref 1.7–7.7)
Neutrophils Relative %: 63 %
Platelet Count: 209 10*3/uL (ref 150–400)
RBC: 3.76 MIL/uL — ABNORMAL LOW (ref 3.87–5.11)
RDW: 15.7 % — ABNORMAL HIGH (ref 11.5–15.5)
WBC Count: 6.5 10*3/uL (ref 4.0–10.5)
nRBC: 0 % (ref 0.0–0.2)
nRBC: 0 /100{WBCs}

## 2023-11-02 LAB — CMP (CANCER CENTER ONLY)
ALT: 9 U/L (ref 0–44)
AST: 23 U/L (ref 15–41)
Albumin: 4.3 g/dL (ref 3.5–5.0)
Alkaline Phosphatase: 145 U/L — ABNORMAL HIGH (ref 38–126)
Anion gap: 12 (ref 5–15)
BUN: 23 mg/dL (ref 8–23)
CO2: 23 mmol/L (ref 22–32)
Calcium: 9.9 mg/dL (ref 8.9–10.3)
Chloride: 105 mmol/L (ref 98–111)
Creatinine: 1.14 mg/dL — ABNORMAL HIGH (ref 0.44–1.00)
GFR, Estimated: 46 mL/min — ABNORMAL LOW (ref >60)
Glucose, Bld: 102 mg/dL — ABNORMAL HIGH (ref 70–99)
Potassium: 3.9 mmol/L (ref 3.5–5.1)
Sodium: 140 mmol/L (ref 135–145)
Total Bilirubin: 0.4 mg/dL (ref <1.2)
Total Protein: 7.4 g/dL (ref 6.5–8.1)

## 2023-11-02 LAB — IRON AND TIBC
Iron: 42 ug/dL (ref 28–170)
Saturation Ratios: 12 % (ref 10.4–31.8)
TIBC: 354 ug/dL (ref 250–450)
UIBC: 312 ug/dL

## 2023-11-02 LAB — FERRITIN: Ferritin: 101 ng/mL (ref 11–307)

## 2023-11-02 NOTE — Progress Notes (Signed)
[facility]  [address] [place],  [place]  [address] [phone]  Clinic Day:  11/02/2023  Referring physician: [name], NP   HISTORY OF PRESENT ILLNESS:  The patient is a 89 y.o. female with iron deficiency anemia.  In the past, IV iron was effective in improving her iron and hemoglobin levels.  She comes in today for routine follow-up.  Since her last visit, the patient has been doing okay.  She has had fatigue recently.  She denies having any overt forms of blood loss which concern her for progressive anemia.   PHYSICAL EXAM:  Blood pressure (!) 211/66, pulse 64, temperature 98.6 F (37 C), resp. rate 16, height 5\' 2"  (1.575 m), weight 133 lb 12.8 oz (60.7 kg), SpO2 100%. Wt Readings from Last 3 Encounters:  11/02/23 133 lb 12.8 oz (60.7 kg)  07/26/23 133 lb 14.4 oz (60.7 kg)  03/21/23 140 lb 9.6 oz (63.8 kg)   Body mass index is 24.47 kg/m. Performance status (ECOG): 1 - Symptomatic but completely ambulatory Physical Exam Constitutional:      Appearance: Normal appearance. She is not ill-appearing.  HENT:     Mouth/Throat:     Mouth: Mucous membranes are moist.     Pharynx: Oropharynx is clear. No oropharyngeal exudate or posterior oropharyngeal erythema.  Cardiovascular:     Rate and Rhythm: Normal rate and regular rhythm.     Heart sounds: No murmur heard.    No friction rub. No gallop.  Pulmonary:     Effort: Pulmonary effort is normal. No respiratory distress.     Breath sounds: Normal breath sounds. No wheezing, rhonchi or rales.  Abdominal:     General: Bowel sounds are normal. There is no distension.     Palpations: Abdomen is soft. There is no mass.     Tenderness: There is no abdominal tenderness.  Musculoskeletal:        General: No swelling.     Right lower leg: No edema.     Left lower leg: No edema.  Lymphadenopathy:     Cervical: No cervical adenopathy.     Upper Body:     Right upper body: No  supraclavicular or axillary adenopathy.     Left upper body: No supraclavicular or axillary adenopathy.     Lower Body: No right inguinal adenopathy. No left inguinal adenopathy.  Skin:    General: Skin is warm.     Coloration: Skin is not jaundiced.     Findings: No lesion or rash.  Neurological:     General: No focal deficit present.     Mental Status: She is alert and oriented to person, place, and time. Mental status is at baseline.  Psychiatric:        Mood and Affect: Mood normal.        Behavior: Behavior normal.        Thought Content: Thought content normal.    LABS:      Latest Ref Rng & Units 11/02/2023   10:32 AM 07/26/2023   12:00 AM 03/21/2023   12:00 AM  CBC  WBC 4.0 - 10.5 K/uL 6.5  4.8     5.1      Hemoglobin 12.0 - 15.0 g/dL 27.2  53.6     64.4      Hematocrit 36.0 - 46.0 % 30.6  30     33      Platelets 150 - 400 K/uL 209  205  216         This result is from an external source.      Latest Ref Rng & Units 11/02/2023   10:32 AM 07/26/2023    9:56 AM 03/21/2023   10:32 AM  CMP  Glucose 70 - 99 mg/dL 403  474  259   BUN 8 - 23 mg/dL 23  23  30    Creatinine 0.44 - 1.00 mg/dL 5.63  8.75  6.43   Sodium 135 - 145 mmol/L 140  137  140   Potassium 3.5 - 5.1 mmol/L 3.9  3.7  3.9   Chloride 98 - 111 mmol/L 105  106  107   CO2 22 - 32 mmol/L 23  23  24    Calcium 8.9 - 10.3 mg/dL 9.9  9.4  9.4   Total Protein 6.5 - 8.1 g/dL 7.4  7.0  7.1   Total Bilirubin <1.2 mg/dL 0.4  0.5  0.4   Alkaline Phos 38 - 126 U/L 145  122  107   AST 15 - 41 U/L 23  22  20    ALT 0 - 44 U/L 9  14  15      Latest Reference Range & Units 11/02/23 10:32  Iron 28 - 170 ug/dL 42  UIBC ug/dL 329  TIBC 518 - 841 ug/dL 660  Saturation Ratios 10.4 - 31.8 % 12  Ferritin 11 - 307 ng/mL 101    ASSESSMENT & PLAN:  Assessment/Plan:  A 89 y.o. female with iron deficiency anemia.  Her labs today do show that her hemoglobin is mildly lower than what it was previously.  However, there is no  evidence of any iron deficiency.  As her hemoglobin remains at/above 10, she will continue to be followed conservatively.  If her hemoglobin falls below 10 and there are no other possible etiologies behind this, the patient may ultimately need a bone marrow biopsy to ensure there is no intrinsic bone marrow disease factoring into her anemia.  I will see her back in 4 months for repeat clinical assessment.  The patient understands all the plans discussed today and is in agreement with them.    Rosana Farnell Kirby Funk, MD

## 2023-11-02 NOTE — Telephone Encounter (Signed)
Patient has been scheduled for follow-up visit per 11/02/23 LOS.  Pt given an appt calendar with date and time.

## 2023-11-03 ENCOUNTER — Telehealth: Payer: Self-pay

## 2023-11-03 NOTE — Telephone Encounter (Signed)
  Latest Reference Range & Units 11/02/23 10:32   Iron 28 - 170 ug/dL 42  UIBC ug/dL 301  TIBC 601 - 093 ug/dL 235  Saturation Ratios 10.4 - 31.8 % 12  Ferritin 11 - 307 ng/mL 101    ASSESSMENT & PLAN:  Assessment/Plan:  A 89 y.o. female with iron deficiency anemia.  Her labs today do show that her hemoglobin is mildly lower than what it was previously.  However, there is no evidence of any iron deficiency.  As her hemoglobin remains at/above 10, she will continue to be followed conservatively.  If her hemoglobin falls below 10 and there are no other possible etiologies behind this, the patient may ultimately need a bone marrow biopsy to ensure there is no intrinsic bone marrow disease factoring into her anemia.  I will see her back in 4 months for repeat clinical assessment.  The patient understands all the plans discussed today and is in agreement with them.     Dequincy Kirby Funk, MD

## 2023-12-07 DIAGNOSIS — E1149 Type 2 diabetes mellitus with other diabetic neurological complication: Secondary | ICD-10-CM | POA: Diagnosis not present

## 2023-12-07 DIAGNOSIS — M79605 Pain in left leg: Secondary | ICD-10-CM | POA: Diagnosis not present

## 2023-12-07 DIAGNOSIS — G5793 Unspecified mononeuropathy of bilateral lower limbs: Secondary | ICD-10-CM | POA: Diagnosis not present

## 2023-12-07 DIAGNOSIS — E039 Hypothyroidism, unspecified: Secondary | ICD-10-CM | POA: Diagnosis not present

## 2023-12-07 DIAGNOSIS — E785 Hyperlipidemia, unspecified: Secondary | ICD-10-CM | POA: Diagnosis not present

## 2023-12-07 DIAGNOSIS — D509 Iron deficiency anemia, unspecified: Secondary | ICD-10-CM | POA: Diagnosis not present

## 2023-12-07 DIAGNOSIS — I10 Essential (primary) hypertension: Secondary | ICD-10-CM | POA: Diagnosis not present

## 2023-12-07 DIAGNOSIS — E559 Vitamin D deficiency, unspecified: Secondary | ICD-10-CM | POA: Diagnosis not present

## 2023-12-07 DIAGNOSIS — M1990 Unspecified osteoarthritis, unspecified site: Secondary | ICD-10-CM | POA: Diagnosis not present

## 2023-12-07 DIAGNOSIS — I700 Atherosclerosis of aorta: Secondary | ICD-10-CM | POA: Diagnosis not present

## 2023-12-22 DIAGNOSIS — D539 Nutritional anemia, unspecified: Secondary | ICD-10-CM | POA: Diagnosis not present

## 2023-12-22 DIAGNOSIS — R5381 Other malaise: Secondary | ICD-10-CM | POA: Diagnosis not present

## 2023-12-22 DIAGNOSIS — R5383 Other fatigue: Secondary | ICD-10-CM | POA: Diagnosis not present

## 2024-01-05 DIAGNOSIS — R634 Abnormal weight loss: Secondary | ICD-10-CM | POA: Diagnosis not present

## 2024-01-05 DIAGNOSIS — I10 Essential (primary) hypertension: Secondary | ICD-10-CM | POA: Diagnosis not present

## 2024-01-05 DIAGNOSIS — G5793 Unspecified mononeuropathy of bilateral lower limbs: Secondary | ICD-10-CM | POA: Diagnosis not present

## 2024-01-05 DIAGNOSIS — A499 Bacterial infection, unspecified: Secondary | ICD-10-CM | POA: Diagnosis not present

## 2024-01-05 DIAGNOSIS — R5381 Other malaise: Secondary | ICD-10-CM | POA: Diagnosis not present

## 2024-01-05 DIAGNOSIS — R5383 Other fatigue: Secondary | ICD-10-CM | POA: Diagnosis not present

## 2024-01-05 DIAGNOSIS — D509 Iron deficiency anemia, unspecified: Secondary | ICD-10-CM | POA: Diagnosis not present

## 2024-01-05 DIAGNOSIS — E1149 Type 2 diabetes mellitus with other diabetic neurological complication: Secondary | ICD-10-CM | POA: Diagnosis not present

## 2024-01-05 DIAGNOSIS — M79605 Pain in left leg: Secondary | ICD-10-CM | POA: Diagnosis not present

## 2024-01-05 DIAGNOSIS — N390 Urinary tract infection, site not specified: Secondary | ICD-10-CM | POA: Diagnosis not present

## 2024-01-05 DIAGNOSIS — M1990 Unspecified osteoarthritis, unspecified site: Secondary | ICD-10-CM | POA: Diagnosis not present

## 2024-02-09 DIAGNOSIS — H2512 Age-related nuclear cataract, left eye: Secondary | ICD-10-CM | POA: Diagnosis not present

## 2024-02-23 DIAGNOSIS — H2589 Other age-related cataract: Secondary | ICD-10-CM | POA: Diagnosis not present

## 2024-02-29 NOTE — Progress Notes (Unsigned)
 [facility]  [address] [place],  [place]  [address] [phone]  Clinic Day:  03/01/2024  Referring physician: [name], NP   HISTORY OF PRESENT ILLNESS:  The patient is a 89 y.o. female with iron deficiency anemia.  In the past, IV iron was effective in improving her iron and hemoglobin levels.  She comes in today for routine follow-up.  Since her last visit, the patient has been doing okay.  She denies having increased fatigue or any overt forms of blood loss which concern her for progressive anemia.   PHYSICAL EXAM:  Blood pressure (!) 163/69, pulse 61, temperature 98.5 F (36.9 C), temperature source Oral, resp. rate 14, height 5\' 2"  (1.575 m), weight 135 lb 5.8 oz (61.4 kg), SpO2 100%. Wt Readings from Last 3 Encounters:  03/01/24 135 lb 5.8 oz (61.4 kg)  11/02/23 133 lb 12.8 oz (60.7 kg)  07/26/23 133 lb 14.4 oz (60.7 kg)   Body mass index is 24.76 kg/m. Performance status (ECOG): 1 - Symptomatic but completely ambulatory Physical Exam Constitutional:      Appearance: Normal appearance. She is not ill-appearing.  HENT:     Mouth/Throat:     Mouth: Mucous membranes are moist.     Pharynx: Oropharynx is clear. No oropharyngeal exudate or posterior oropharyngeal erythema.  Cardiovascular:     Rate and Rhythm: Normal rate and regular rhythm.     Heart sounds: No murmur heard.    No friction rub. No gallop.  Pulmonary:     Effort: Pulmonary effort is normal. No respiratory distress.     Breath sounds: Normal breath sounds. No wheezing, rhonchi or rales.  Abdominal:     General: Bowel sounds are normal. There is no distension.     Palpations: Abdomen is soft. There is no mass.     Tenderness: There is no abdominal tenderness.  Musculoskeletal:        General: No swelling.     Right lower leg: No edema.     Left lower leg: No edema.  Lymphadenopathy:     Cervical: No cervical adenopathy.     Upper Body:     Right upper body: No  supraclavicular or axillary adenopathy.     Left upper body: No supraclavicular or axillary adenopathy.     Lower Body: No right inguinal adenopathy. No left inguinal adenopathy.  Skin:    General: Skin is warm.     Coloration: Skin is not jaundiced.     Findings: No lesion or rash.  Neurological:     General: No focal deficit present.     Mental Status: She is alert and oriented to person, place, and time. Mental status is at baseline.  Psychiatric:        Mood and Affect: Mood normal.        Behavior: Behavior normal.        Thought Content: Thought content normal.    LABS:      Latest Ref Rng & Units 03/01/2024   10:24 AM 11/02/2023   10:32 AM 07/26/2023   12:00 AM  CBC  WBC 4.0 - 10.5 K/uL 4.9  6.5  4.8      Hemoglobin 12.0 - 15.0 g/dL 91.4  78.2  95.6      Hematocrit 36.0 - 46.0 % 31.6  30.6  30      Platelets 150 - 400 K/uL 205  209  205  This result is from an external source.      Latest Ref Rng & Units 03/01/2024   10:24 AM 11/02/2023   10:32 AM 07/26/2023    9:56 AM  CMP  Glucose 70 - 99 mg/dL 161  096  045   BUN 8 - 23 mg/dL 32  23  23   Creatinine 0.44 - 1.00 mg/dL 4.09  8.11  9.14   Sodium 135 - 145 mmol/L 138  140  137   Potassium 3.5 - 5.1 mmol/L 4.4  3.9  3.7   Chloride 98 - 111 mmol/L 103  105  106   CO2 22 - 32 mmol/L 25  23  23    Calcium 8.9 - 10.3 mg/dL 9.9  9.9  9.4   Total Protein 6.5 - 8.1 g/dL 7.5  7.4  7.0   Total Bilirubin 0.0 - 1.2 mg/dL 0.3  0.4  0.5   Alkaline Phos 38 - 126 U/L 194  145  122   AST 15 - 41 U/L 23  23  22    ALT 0 - 44 U/L 12  9  14      Latest Reference Range & Units 03/01/24 10:24  Iron 28 - 170 ug/dL 50  UIBC ug/dL 782  TIBC 956 - 213 ug/dL 086  Saturation Ratios 10.4 - 31.8 % 14  Ferritin 11 - 307 ng/mL 72  Folate >5.9 ng/mL 11.9  Vitamin B12 180 - 914 pg/mL 440   ASSESSMENT & PLAN:  Assessment/Plan:  An 89 y.o. female with iron deficiency anemia.  Based upon her labs today, a component of renal insufficiency  may also be factoring to her anemia.  However, I am pleased as her hemoglobin is better today at 10.5 than what it was 4 months ago.  Clinically, the patient appears to be doing well.  As that is the case, I will see her back in 4 months for repeat clinical assessment.  The patient understands all the plans discussed today and is in agreement with them.    Raileigh Sabater Kirby Funk, MD

## 2024-03-01 ENCOUNTER — Telehealth: Payer: Self-pay | Admitting: Oncology

## 2024-03-01 ENCOUNTER — Inpatient Hospital Stay: Payer: Medicare Other | Admitting: Oncology

## 2024-03-01 ENCOUNTER — Inpatient Hospital Stay: Payer: Medicare Other | Attending: Oncology

## 2024-03-01 ENCOUNTER — Other Ambulatory Visit: Payer: Self-pay | Admitting: Oncology

## 2024-03-01 VITALS — BP 163/69 | HR 61 | Temp 98.5°F | Resp 14 | Ht 62.0 in | Wt 135.4 lb

## 2024-03-01 DIAGNOSIS — N289 Disorder of kidney and ureter, unspecified: Secondary | ICD-10-CM | POA: Diagnosis not present

## 2024-03-01 DIAGNOSIS — D509 Iron deficiency anemia, unspecified: Secondary | ICD-10-CM | POA: Insufficient documentation

## 2024-03-01 DIAGNOSIS — D500 Iron deficiency anemia secondary to blood loss (chronic): Secondary | ICD-10-CM | POA: Diagnosis not present

## 2024-03-01 DIAGNOSIS — D508 Other iron deficiency anemias: Secondary | ICD-10-CM

## 2024-03-01 LAB — CBC WITH DIFFERENTIAL (CANCER CENTER ONLY)
Abs Immature Granulocytes: 0.01 10*3/uL (ref 0.00–0.07)
Basophils Absolute: 0 10*3/uL (ref 0.0–0.1)
Basophils Relative: 0 %
Eosinophils Absolute: 0.2 10*3/uL (ref 0.0–0.5)
Eosinophils Relative: 5 %
HCT: 31.6 % — ABNORMAL LOW (ref 36.0–46.0)
Hemoglobin: 10.5 g/dL — ABNORMAL LOW (ref 12.0–15.0)
Immature Granulocytes: 0 %
Lymphocytes Relative: 29 %
Lymphs Abs: 1.4 10*3/uL (ref 0.7–4.0)
MCH: 26.8 pg (ref 26.0–34.0)
MCHC: 33.2 g/dL (ref 30.0–36.0)
MCV: 80.6 fL (ref 80.0–100.0)
Monocytes Absolute: 0.7 10*3/uL (ref 0.1–1.0)
Monocytes Relative: 14 %
Neutro Abs: 2.6 10*3/uL (ref 1.7–7.7)
Neutrophils Relative %: 52 %
Platelet Count: 205 10*3/uL (ref 150–400)
RBC: 3.92 MIL/uL (ref 3.87–5.11)
RDW: 17.1 % — ABNORMAL HIGH (ref 11.5–15.5)
WBC Count: 4.9 10*3/uL (ref 4.0–10.5)
nRBC: 0 % (ref 0.0–0.2)
nRBC: 0 /100{WBCs}

## 2024-03-01 LAB — CMP (CANCER CENTER ONLY)
ALT: 12 U/L (ref 0–44)
AST: 23 U/L (ref 15–41)
Albumin: 4.4 g/dL (ref 3.5–5.0)
Alkaline Phosphatase: 194 U/L — ABNORMAL HIGH (ref 38–126)
Anion gap: 11 (ref 5–15)
BUN: 32 mg/dL — ABNORMAL HIGH (ref 8–23)
CO2: 25 mmol/L (ref 22–32)
Calcium: 9.9 mg/dL (ref 8.9–10.3)
Chloride: 103 mmol/L (ref 98–111)
Creatinine: 1.36 mg/dL — ABNORMAL HIGH (ref 0.44–1.00)
GFR, Estimated: 37 mL/min — ABNORMAL LOW (ref >60)
Glucose, Bld: 116 mg/dL — ABNORMAL HIGH (ref 70–99)
Potassium: 4.4 mmol/L (ref 3.5–5.1)
Sodium: 138 mmol/L (ref 135–145)
Total Bilirubin: 0.3 mg/dL (ref 0.0–1.2)
Total Protein: 7.5 g/dL (ref 6.5–8.1)

## 2024-03-01 LAB — IRON AND TIBC
Iron: 50 ug/dL (ref 28–170)
Saturation Ratios: 14 % (ref 10.4–31.8)
TIBC: 371 ug/dL (ref 250–450)
UIBC: 321 ug/dL

## 2024-03-01 LAB — VITAMIN B12: Vitamin B-12: 440 pg/mL (ref 180–914)

## 2024-03-01 LAB — FERRITIN: Ferritin: 72 ng/mL (ref 11–307)

## 2024-03-01 LAB — FOLATE: Folate: 11.9 ng/mL (ref >5.9)

## 2024-03-01 NOTE — Telephone Encounter (Signed)
 Contacted pt to schedule an appt. Unable to reach via phone, voicemail was left.    Follow-Up Information  Follow-up disposition: Return in about 4 months (around 07/01/2024).  Check out comments: labs

## 2024-03-07 DIAGNOSIS — I700 Atherosclerosis of aorta: Secondary | ICD-10-CM | POA: Diagnosis not present

## 2024-03-07 DIAGNOSIS — M79605 Pain in left leg: Secondary | ICD-10-CM | POA: Diagnosis not present

## 2024-03-07 DIAGNOSIS — M1990 Unspecified osteoarthritis, unspecified site: Secondary | ICD-10-CM | POA: Diagnosis not present

## 2024-03-07 DIAGNOSIS — G5793 Unspecified mononeuropathy of bilateral lower limbs: Secondary | ICD-10-CM | POA: Diagnosis not present

## 2024-03-07 DIAGNOSIS — E559 Vitamin D deficiency, unspecified: Secondary | ICD-10-CM | POA: Diagnosis not present

## 2024-03-07 DIAGNOSIS — E039 Hypothyroidism, unspecified: Secondary | ICD-10-CM | POA: Diagnosis not present

## 2024-03-07 DIAGNOSIS — D509 Iron deficiency anemia, unspecified: Secondary | ICD-10-CM | POA: Diagnosis not present

## 2024-03-07 DIAGNOSIS — E785 Hyperlipidemia, unspecified: Secondary | ICD-10-CM | POA: Diagnosis not present

## 2024-03-07 DIAGNOSIS — E1149 Type 2 diabetes mellitus with other diabetic neurological complication: Secondary | ICD-10-CM | POA: Diagnosis not present

## 2024-03-07 DIAGNOSIS — I10 Essential (primary) hypertension: Secondary | ICD-10-CM | POA: Diagnosis not present

## 2024-03-29 DIAGNOSIS — Z01818 Encounter for other preprocedural examination: Secondary | ICD-10-CM | POA: Diagnosis not present

## 2024-03-29 DIAGNOSIS — H2589 Other age-related cataract: Secondary | ICD-10-CM | POA: Diagnosis not present

## 2024-04-09 DIAGNOSIS — E119 Type 2 diabetes mellitus without complications: Secondary | ICD-10-CM | POA: Diagnosis not present

## 2024-04-09 DIAGNOSIS — H259 Unspecified age-related cataract: Secondary | ICD-10-CM | POA: Diagnosis not present

## 2024-04-09 DIAGNOSIS — H25812 Combined forms of age-related cataract, left eye: Secondary | ICD-10-CM | POA: Diagnosis not present

## 2024-04-09 DIAGNOSIS — E1136 Type 2 diabetes mellitus with diabetic cataract: Secondary | ICD-10-CM | POA: Diagnosis not present

## 2024-04-09 DIAGNOSIS — I10 Essential (primary) hypertension: Secondary | ICD-10-CM | POA: Diagnosis not present

## 2024-04-09 DIAGNOSIS — H2589 Other age-related cataract: Secondary | ICD-10-CM | POA: Diagnosis not present

## 2024-06-11 DIAGNOSIS — M79604 Pain in right leg: Secondary | ICD-10-CM | POA: Diagnosis not present

## 2024-06-11 DIAGNOSIS — M79605 Pain in left leg: Secondary | ICD-10-CM | POA: Diagnosis not present

## 2024-06-11 DIAGNOSIS — E559 Vitamin D deficiency, unspecified: Secondary | ICD-10-CM | POA: Diagnosis not present

## 2024-06-11 DIAGNOSIS — D509 Iron deficiency anemia, unspecified: Secondary | ICD-10-CM | POA: Diagnosis not present

## 2024-06-11 DIAGNOSIS — M1990 Unspecified osteoarthritis, unspecified site: Secondary | ICD-10-CM | POA: Diagnosis not present

## 2024-06-11 DIAGNOSIS — E039 Hypothyroidism, unspecified: Secondary | ICD-10-CM | POA: Diagnosis not present

## 2024-06-11 DIAGNOSIS — I10 Essential (primary) hypertension: Secondary | ICD-10-CM | POA: Diagnosis not present

## 2024-06-11 DIAGNOSIS — I700 Atherosclerosis of aorta: Secondary | ICD-10-CM | POA: Diagnosis not present

## 2024-06-11 DIAGNOSIS — G5793 Unspecified mononeuropathy of bilateral lower limbs: Secondary | ICD-10-CM | POA: Diagnosis not present

## 2024-06-11 DIAGNOSIS — E785 Hyperlipidemia, unspecified: Secondary | ICD-10-CM | POA: Diagnosis not present

## 2024-06-11 DIAGNOSIS — E1149 Type 2 diabetes mellitus with other diabetic neurological complication: Secondary | ICD-10-CM | POA: Diagnosis not present

## 2024-06-14 DIAGNOSIS — R221 Localized swelling, mass and lump, neck: Secondary | ICD-10-CM | POA: Diagnosis not present

## 2024-07-01 ENCOUNTER — Inpatient Hospital Stay: Admitting: Oncology

## 2024-07-01 ENCOUNTER — Inpatient Hospital Stay

## 2024-07-02 NOTE — Progress Notes (Unsigned)
 Office Note     CC: Pulsatile neck mass Requesting Provider:  Erick Greig LABOR, NP  HPI: Michelle Allen is Allen 89 y.o. ([birth year]) female presenting at the request of .Moon, Michelle A, NP for postop.  On exam, Michelle Allen was doing well.  Allen native [facility], she moved to [place] at the age of 59.  She was 1 of 12 children.  She has no biologic children, but fostered several over the course of her life.  She worked for [employer] for over 25 years, and is now retired.  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  Michelle Allen has appreciated Allen pulsatile mass in the left neck for quite some time.  She states it has not worsened, but she is worried about it for Allen number of years.  Denies history of TIA, stroke, amaurosis.    Past Medical History:  Diagnosis Date   Anemia    Arthritis    Hyperlipidemia    Hypertension    Rash    rt side of neck    Past Surgical History:  Procedure Laterality Date   BREAST LUMPECTOMY  44 yrs ago   benign   CESAREAN SECTION     HAND SURGERY  44 yrs ago   left   TOTAL KNEE ARTHROPLASTY Right 02/13/2015   Procedure: RIGHT TOTAL KNEE ARTHROPLASTY;  Surgeon: Tanda LABOR Heading, MD;  Location: [facility];  Service: Orthopedics;  Laterality: Right;    Social History   Socioeconomic History   Marital status: Single    Spouse name: Not on file   Number of children: 1   Years of education: 12 + 2   Highest education level: Not on file  Occupational History   Occupation: [employer]  Tobacco Use   Smoking status: Never   Smokeless tobacco: Never  Vaping Use   Vaping status: Never Used  Substance and Sexual Activity   Alcohol use: Not Currently    Comment: very rare   Drug use: No   Sexual activity: Not Currently  Other Topics Concern   Not on file  Social History Narrative   Not on file   Social Drivers of Health   Financial Resource Strain: Not on file  Food Insecurity: Not on  file  Transportation Needs: Not on file  Physical Activity: Not on file  Stress: Not on file  Social Connections: Not on file  Intimate Partner Violence: Not on file   Family History  Problem Relation Age of Onset   Hypertension Mother    Diabetes Mother    Heart disease Mother    Ovarian cancer Mother    Heart disease Father    Diabetes Father    Diabetes Sister    Hypertension Sister    Prostate cancer Brother    Prostate cancer Brother    Colon cancer Child     Current Outpatient Medications  Medication Sig Dispense Refill   Cyanocobalamin  (VITAMIN B-12 PO) Take by mouth.     ferrous fumarate  (HEMOCYTE - 106 MG FE) 325 (106 FE) MG TABS tablet Take 1 tablet by mouth daily.     glimepiride  (AMARYL ) 4 MG tablet Take 4 mg by mouth daily with breakfast.     losartan -hydrochlorothiazide  (HYZAAR) 100-25 MG per tablet Take 1 tablet by mouth every morning.     meclizine  (ANTIVERT ) 25 MG tablet Take 25 mg by mouth 3 (three) times daily as needed for  dizziness. (Patient not taking: Reported on 03/01/2024)     traMADol (ULTRAM) 50 MG tablet Take 50 mg by mouth every 6 (six) hours as needed for moderate pain.     VITAMIN D PO Take by mouth.     No current facility-administered medications for this visit.    Allergies  Allergen Reactions   Penicillins Rash     REVIEW OF SYSTEMS:  [X]  denotes positive finding, [ ]  denotes negative finding Cardiac  Comments:  Chest pain or chest pressure:    Shortness of breath upon exertion:    Short of breath when lying flat:    Irregular heart rhythm:        Vascular    Pain in calf, thigh, or hip brought on by ambulation:    Pain in feet at night that wakes you up from your sleep:     Blood clot in your veins:    Leg swelling:         Pulmonary    Oxygen at home:    Productive cough:     Wheezing:         Neurologic    Sudden weakness in arms or legs:     Sudden numbness in arms or legs:     Sudden onset of difficulty speaking or  slurred speech:    Temporary loss of vision in one eye:     Problems with dizziness:         Gastrointestinal    Blood in stool:     Vomited blood:         Genitourinary    Burning when urinating:     Blood in urine:        Psychiatric    Major depression:         Hematologic    Bleeding problems:    Problems with blood clotting too easily:        Skin    Rashes or ulcers:        Constitutional    Fever or chills:      PHYSICAL EXAMINATION:  There were no vitals filed for this visit.  General:  WDWN in NAD; vital signs documented above Gait: Not observed HENT: WNL, normocephalic, pulsatile right neck.  Nonpainful.  No jugular venous distention. Pulmonary: normal non-labored breathing , without wheezing Cardiac: regular HR Abdomen: soft, NT, no masses Skin: without rashes Vascular Exam/Pulses:  Right Left  Radial 2+ (normal) 2+ (normal)  Ulnar    Femoral    Popliteal    DP    PT     Extremities: without ischemic changes, without Gangrene , without cellulitis; without open wounds;  Musculoskeletal: no muscle wasting or atrophy  Neurologic: Allen&O X 3;  No focal weakness or paresthesias are detected Psychiatric:  The pt has Normal affect.   Non-Invasive Vascular Imaging:     FINDINGS: The brachiocephalic trunk is ectatic at 16 mm; this represents the area of palpable concern. The vessel is rather tortuous. It is 9 mm at the proximal common carotid. The proximal subclavian is similar to the carotid in size. The nature of the vessel is best seen on the cinegraphic images. Color and spectral Doppler flow is present in this region. Color aliasing is present largely because of tortuosity. Note: This region was not imaged well on prior standard carotid series of October 23, 2018.     ASSESSMENT/PLAN: [name] is Allen 89 y.o. 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    I evaluated the previous duplex  ultrasound, as well as performed duplex ultrasonography myself during the visit today.  The size of the innominate vessel is 16 mm which is slightly larger than normal, but not considered aneurysmal.  There is significant tortuosity, which again, can be Allen normal finding.  Being that this has been present for Allen number of years, and has not any worse, I think she can follow-up in my office as needed.  Should she feel like the lesion is increasing in size, would recommend CT angiogram chest and neck, and I am happy to see her back in the office.  At this time, I do not think it has anything to worry about.  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    Fonda FORBES Rim, MD [facility] [phone]

## 2024-07-04 ENCOUNTER — Ambulatory Visit: Attending: Vascular Surgery | Admitting: Vascular Surgery

## 2024-07-04 ENCOUNTER — Encounter: Payer: Self-pay | Admitting: Vascular Surgery

## 2024-07-04 VITALS — BP 172/93 | HR 72 | Temp 98.4°F | Resp 18 | Ht 62.0 in | Wt 128.6 lb

## 2024-07-04 DIAGNOSIS — R221 Localized swelling, mass and lump, neck: Secondary | ICD-10-CM | POA: Diagnosis not present

## 2024-07-04 NOTE — Progress Notes (Unsigned)
 [facility]  [address] [place],  [place]  [address] [phone]  Clinic Day:  07/05/2024  Referring physician: Erick Greig LABOR, NP   HISTORY OF PRESENT ILLNESS:  The patient is a 89 y.o. female with iron deficiency anemia.  Of note, she last received IV iron in January 2023.  She comes in today for routine follow-up.  Since her last visit, the patient has been doing okay.  She does complain of increased fatigue, but denies having any overt forms of blood loss.  PHYSICAL EXAM:  Blood pressure (!) 158/72, pulse 69, temperature 98.3 F (36.8 C), temperature source Oral, resp. rate 14, height 5' 2 (1.575 m), weight 129 lb 8 oz (58.7 kg), SpO2 98%. Wt Readings from Last 3 Encounters:  07/05/24 129 lb 8 oz (58.7 kg)  07/04/24 128 lb 9.6 oz (58.3 kg)  03/01/24 135 lb 5.8 oz (61.4 kg)   Body mass index is 23.69 kg/m. Performance status (ECOG): 1 - Symptomatic but completely ambulatory Physical Exam Constitutional:      Appearance: Normal appearance. She is not ill-appearing.  HENT:     Mouth/Throat:     Mouth: Mucous membranes are moist.     Pharynx: Oropharynx is clear. No oropharyngeal exudate or posterior oropharyngeal erythema.  Cardiovascular:     Rate and Rhythm: Normal rate and regular rhythm.     Heart sounds: No murmur heard.    No friction rub. No gallop.  Pulmonary:     Effort: Pulmonary effort is normal. No respiratory distress.     Breath sounds: Normal breath sounds. No wheezing, rhonchi or rales.  Abdominal:     General: Bowel sounds are normal. There is no distension.     Palpations: Abdomen is soft. There is no mass.     Tenderness: There is no abdominal tenderness.  Musculoskeletal:        General: No swelling.     Right lower leg: No edema.     Left lower leg: No edema.  Lymphadenopathy:     Cervical: No cervical adenopathy.     Upper Body:     Right upper body: No supraclavicular or axillary adenopathy.     Left upper  body: No supraclavicular or axillary adenopathy.     Lower Body: No right inguinal adenopathy. No left inguinal adenopathy.  Skin:    General: Skin is warm.     Coloration: Skin is not jaundiced.     Findings: No lesion or rash.  Neurological:     General: No focal deficit present.     Mental Status: She is alert and oriented to person, place, and time. Mental status is at baseline.  Psychiatric:        Mood and Affect: Mood normal.        Behavior: Behavior normal.        Thought Content: Thought content normal.    LABS:      Latest Ref Rng & Units 07/05/2024    9:36 AM 03/01/2024   10:24 AM 11/02/2023   10:32 AM  CBC  WBC 4.0 - 10.5 K/uL 5.0  4.9  6.5   Hemoglobin 12.0 - 15.0 g/dL 89.8  89.4  89.9   Hematocrit 36.0 - 46.0 % 31.8  31.6  30.6   Platelets 150 - 400 K/uL 239  205  209       Latest Ref Rng & Units 07/05/2024    9:36 AM 03/01/2024   10:24 AM 11/02/2023  10:32 AM  CMP  Glucose 70 - 99 mg/dL 832  883  897   BUN 8 - 23 mg/dL 26  32  23   Creatinine 0.44 - 1.00 mg/dL 8.78  8.63  8.85   Sodium 135 - 145 mmol/L 138  138  140   Potassium 3.5 - 5.1 mmol/L 3.6  4.4  3.9   Chloride 98 - 111 mmol/L 103  103  105   CO2 22 - 32 mmol/L 22  25  23    Calcium 8.9 - 10.3 mg/dL 89.9  9.9  9.9   Total Protein 6.5 - 8.1 g/dL 7.4  7.5  7.4   Total Bilirubin 0.0 - 1.2 mg/dL 0.6  0.3  0.4   Alkaline Phos 38 - 126 U/L 121  194  145   AST 15 - 41 U/L 22  23  23    ALT 0 - 44 U/L 9  12  9      Latest Reference Range & Units 07/05/24 09:36  Iron 28 - 170 ug/dL 90  UIBC ug/dL 764  TIBC 749 - 549 ug/dL 674  Saturation Ratios 10.4 - 31.8 % 28  Ferritin 11 - 307 ng/mL 96   ASSESSMENT & PLAN:  Assessment/Plan:  An 89 y.o. female with a history of iron deficiency anemia.  I am somewhat pleased as her hemoglobin remains above 10.  She also has normal iron studies today.  A component of her anemia can likely be explained by her renal insufficiency.  Clinically, the patient appears to be  doing okay.  As that is the case, I will see her back in 4 months for repeat clinical assessment.  The patient understands all the plans discussed today and is in agreement with them.    Fielding Mault DELENA Kerns, MD

## 2024-07-05 ENCOUNTER — Other Ambulatory Visit: Payer: Self-pay

## 2024-07-05 ENCOUNTER — Inpatient Hospital Stay: Admitting: Oncology

## 2024-07-05 ENCOUNTER — Telehealth: Payer: Self-pay | Admitting: Oncology

## 2024-07-05 ENCOUNTER — Other Ambulatory Visit: Payer: Self-pay | Admitting: Oncology

## 2024-07-05 ENCOUNTER — Inpatient Hospital Stay: Attending: Oncology

## 2024-07-05 VITALS — BP 158/72 | HR 69 | Temp 98.3°F | Resp 14 | Ht 62.0 in | Wt 129.5 lb

## 2024-07-05 DIAGNOSIS — N289 Disorder of kidney and ureter, unspecified: Secondary | ICD-10-CM | POA: Insufficient documentation

## 2024-07-05 DIAGNOSIS — D509 Iron deficiency anemia, unspecified: Secondary | ICD-10-CM | POA: Diagnosis not present

## 2024-07-05 DIAGNOSIS — D631 Anemia in chronic kidney disease: Secondary | ICD-10-CM

## 2024-07-05 DIAGNOSIS — D508 Other iron deficiency anemias: Secondary | ICD-10-CM

## 2024-07-05 DIAGNOSIS — N189 Chronic kidney disease, unspecified: Secondary | ICD-10-CM

## 2024-07-05 LAB — CMP (CANCER CENTER ONLY)
ALT: 9 U/L (ref 0–44)
AST: 22 U/L (ref 15–41)
Albumin: 4 g/dL (ref 3.5–5.0)
Alkaline Phosphatase: 121 U/L (ref 38–126)
Anion gap: 13 (ref 5–15)
BUN: 26 mg/dL — ABNORMAL HIGH (ref 8–23)
CO2: 22 mmol/L (ref 22–32)
Calcium: 10 mg/dL (ref 8.9–10.3)
Chloride: 103 mmol/L (ref 98–111)
Creatinine: 1.21 mg/dL — ABNORMAL HIGH (ref 0.44–1.00)
GFR, Estimated: 43 mL/min — ABNORMAL LOW (ref >60)
Glucose, Bld: 167 mg/dL — ABNORMAL HIGH (ref 70–99)
Potassium: 3.6 mmol/L (ref 3.5–5.1)
Sodium: 138 mmol/L (ref 135–145)
Total Bilirubin: 0.6 mg/dL (ref 0.0–1.2)
Total Protein: 7.4 g/dL (ref 6.5–8.1)

## 2024-07-05 LAB — CBC WITH DIFFERENTIAL (CANCER CENTER ONLY)
Abs Immature Granulocytes: 0.02 K/uL (ref 0.00–0.07)
Basophils Absolute: 0 K/uL (ref 0.0–0.1)
Basophils Relative: 0 %
Eosinophils Absolute: 0.2 K/uL (ref 0.0–0.5)
Eosinophils Relative: 4 %
HCT: 31.8 % — ABNORMAL LOW (ref 36.0–46.0)
Hemoglobin: 10.1 g/dL — ABNORMAL LOW (ref 12.0–15.0)
Immature Granulocytes: 0 %
Lymphocytes Relative: 25 %
Lymphs Abs: 1.2 K/uL (ref 0.7–4.0)
MCH: 25.9 pg — ABNORMAL LOW (ref 26.0–34.0)
MCHC: 31.8 g/dL (ref 30.0–36.0)
MCV: 81.5 fL (ref 80.0–100.0)
Monocytes Absolute: 0.6 K/uL (ref 0.1–1.0)
Monocytes Relative: 11 %
Neutro Abs: 3 K/uL (ref 1.7–7.7)
Neutrophils Relative %: 60 %
Platelet Count: 239 K/uL (ref 150–400)
RBC: 3.9 MIL/uL (ref 3.87–5.11)
RDW: 15.8 % — ABNORMAL HIGH (ref 11.5–15.5)
WBC Count: 5 K/uL (ref 4.0–10.5)
nRBC: 0 % (ref 0.0–0.2)

## 2024-07-05 LAB — IRON AND TIBC
Iron: 90 ug/dL (ref 28–170)
Saturation Ratios: 28 % (ref 10.4–31.8)
TIBC: 325 ug/dL (ref 250–450)
UIBC: 235 ug/dL

## 2024-07-05 LAB — FERRITIN: Ferritin: 96 ng/mL (ref 11–307)

## 2024-07-05 NOTE — Telephone Encounter (Signed)
 Patient has been scheduled for follow-up visit per 07/05/24 LOS.  Pt given an appt calendar with date and time.

## 2024-07-26 ENCOUNTER — Encounter: Admitting: Vascular Surgery

## 2024-09-12 DIAGNOSIS — E1149 Type 2 diabetes mellitus with other diabetic neurological complication: Secondary | ICD-10-CM | POA: Diagnosis not present

## 2024-09-12 DIAGNOSIS — R5383 Other fatigue: Secondary | ICD-10-CM | POA: Diagnosis not present

## 2024-09-12 DIAGNOSIS — Z9181 History of falling: Secondary | ICD-10-CM | POA: Diagnosis not present

## 2024-09-12 DIAGNOSIS — Z6824 Body mass index (BMI) 24.0-24.9, adult: Secondary | ICD-10-CM | POA: Diagnosis not present

## 2024-09-12 DIAGNOSIS — N179 Acute kidney failure, unspecified: Secondary | ICD-10-CM | POA: Diagnosis not present

## 2024-09-12 DIAGNOSIS — E785 Hyperlipidemia, unspecified: Secondary | ICD-10-CM | POA: Diagnosis not present

## 2024-09-12 DIAGNOSIS — I444 Left anterior fascicular block: Secondary | ICD-10-CM | POA: Diagnosis not present

## 2024-09-12 DIAGNOSIS — N390 Urinary tract infection, site not specified: Secondary | ICD-10-CM | POA: Diagnosis not present

## 2024-09-12 DIAGNOSIS — Z79899 Other long term (current) drug therapy: Secondary | ICD-10-CM | POA: Diagnosis not present

## 2024-09-12 DIAGNOSIS — Z2821 Immunization not carried out because of patient refusal: Secondary | ICD-10-CM | POA: Diagnosis not present

## 2024-09-12 DIAGNOSIS — I10 Essential (primary) hypertension: Secondary | ICD-10-CM | POA: Diagnosis not present

## 2024-09-12 DIAGNOSIS — R9431 Abnormal electrocardiogram [ECG] [EKG]: Secondary | ICD-10-CM | POA: Diagnosis not present

## 2024-09-12 DIAGNOSIS — E039 Hypothyroidism, unspecified: Secondary | ICD-10-CM | POA: Diagnosis not present

## 2024-09-12 DIAGNOSIS — M1990 Unspecified osteoarthritis, unspecified site: Secondary | ICD-10-CM | POA: Diagnosis not present

## 2024-09-12 DIAGNOSIS — D509 Iron deficiency anemia, unspecified: Secondary | ICD-10-CM | POA: Diagnosis not present

## 2024-09-12 DIAGNOSIS — I700 Atherosclerosis of aorta: Secondary | ICD-10-CM | POA: Diagnosis not present

## 2024-09-12 DIAGNOSIS — R531 Weakness: Secondary | ICD-10-CM | POA: Diagnosis not present

## 2024-09-12 DIAGNOSIS — R0602 Shortness of breath: Secondary | ICD-10-CM | POA: Diagnosis not present

## 2024-09-12 DIAGNOSIS — E860 Dehydration: Secondary | ICD-10-CM | POA: Diagnosis not present

## 2024-10-10 DIAGNOSIS — N1832 Chronic kidney disease, stage 3b: Secondary | ICD-10-CM | POA: Diagnosis not present

## 2024-10-10 DIAGNOSIS — R531 Weakness: Secondary | ICD-10-CM | POA: Diagnosis not present

## 2024-10-10 DIAGNOSIS — I10 Essential (primary) hypertension: Secondary | ICD-10-CM | POA: Diagnosis not present

## 2024-10-10 DIAGNOSIS — R634 Abnormal weight loss: Secondary | ICD-10-CM | POA: Diagnosis not present

## 2024-10-10 DIAGNOSIS — N179 Acute kidney failure, unspecified: Secondary | ICD-10-CM | POA: Diagnosis not present

## 2024-11-04 NOTE — Progress Notes (Unsigned)
 [facility]  [address] [place],  [place]  [address] [phone]  Clinic Day:  11/04/2024  Referring physician: Erick Allen LABOR, NP   HISTORY OF PRESENT ILLNESS:  The patient is a 89 y.o. female with iron deficiency anemia.  Of note, she last received IV iron in January 2023.  She comes in today for routine follow-up.  Since her last visit, the patient has been doing okay.  She does complain of increased fatigue, but denies having any overt forms of blood loss.  PHYSICAL EXAM:  There were no vitals taken for this visit. Wt Readings from Last 3 Encounters:  07/05/24 129 lb 8 oz (58.7 kg)  07/04/24 128 lb 9.6 oz (58.3 kg)  03/01/24 135 lb 5.8 oz (61.4 kg)   There is no height or weight on file to calculate BMI. Performance status (ECOG): 1 - Symptomatic but completely ambulatory Physical Exam Constitutional:      Appearance: Normal appearance. She is not ill-appearing.  HENT:     Mouth/Throat:     Mouth: Mucous membranes are moist.     Pharynx: Oropharynx is clear. No oropharyngeal exudate or posterior oropharyngeal erythema.  Cardiovascular:     Rate and Rhythm: Normal rate and regular rhythm.     Heart sounds: No murmur heard.    No friction rub. No gallop.  Pulmonary:     Effort: Pulmonary effort is normal. No respiratory distress.     Breath sounds: Normal breath sounds. No wheezing, rhonchi or rales.  Abdominal:     General: Bowel sounds are normal. There is no distension.     Palpations: Abdomen is soft. There is no mass.     Tenderness: There is no abdominal tenderness.  Musculoskeletal:        General: No swelling.     Right lower leg: No edema.     Left lower leg: No edema.  Lymphadenopathy:     Cervical: No cervical adenopathy.     Upper Body:     Right upper body: No supraclavicular or axillary adenopathy.     Left upper body: No supraclavicular or axillary adenopathy.     Lower Body: No right inguinal adenopathy. No left  inguinal adenopathy.  Skin:    General: Skin is warm.     Coloration: Skin is not jaundiced.     Findings: No lesion or rash.  Neurological:     General: No focal deficit present.     Mental Status: She is alert and oriented to person, place, and time. Mental status is at baseline.  Psychiatric:        Mood and Affect: Mood normal.        Behavior: Behavior normal.        Thought Content: Thought content normal.    LABS:      Latest Ref Rng & Units 07/05/2024    9:36 AM 03/01/2024   10:24 AM 11/02/2023   10:32 AM  CBC  WBC 4.0 - 10.5 K/uL 5.0  4.9  6.5   Hemoglobin 12.0 - 15.0 g/dL 89.8  89.4  89.9   Hematocrit 36.0 - 46.0 % 31.8  31.6  30.6   Platelets 150 - 400 K/uL 239  205  209       Latest Ref Rng & Units 07/05/2024    9:36 AM 03/01/2024   10:24 AM 11/02/2023   10:32 AM  CMP  Glucose 70 - 99 mg/dL 832  883  897  BUN 8 - 23 mg/dL 26  32  23   Creatinine 0.44 - 1.00 mg/dL 8.78  8.63  8.85   Sodium 135 - 145 mmol/L 138  138  140   Potassium 3.5 - 5.1 mmol/L 3.6  4.4  3.9   Chloride 98 - 111 mmol/L 103  103  105   CO2 22 - 32 mmol/L 22  25  23    Calcium 8.9 - 10.3 mg/dL 89.9  9.9  9.9   Total Protein 6.5 - 8.1 g/dL 7.4  7.5  7.4   Total Bilirubin 0.0 - 1.2 mg/dL 0.6  0.3  0.4   Alkaline Phos 38 - 126 U/L 121  194  145   AST 15 - 41 U/L 22  23  23    ALT 0 - 44 U/L 9  12  9      Latest Reference Range & Units 07/05/24 09:36  Iron 28 - 170 ug/dL 90  UIBC ug/dL 764  TIBC 749 - 549 ug/dL 674  Saturation Ratios 10.4 - 31.8 % 28  Ferritin 11 - 307 ng/mL 96   ASSESSMENT & PLAN:  Assessment/Plan:  An 89 y.o. female with a history of iron deficiency anemia.  I am somewhat pleased as her hemoglobin remains above 10.  She also has normal iron studies today.  A component of her anemia can likely be explained by her renal insufficiency.  Clinically, the patient appears to be doing okay.  As that is the case, I will see her back in 4 months for repeat clinical assessment.  The  patient understands all the plans discussed today and is in agreement with them.    Michelle Allen Michelle Kerns, MD

## 2024-11-05 ENCOUNTER — Other Ambulatory Visit: Payer: Self-pay | Admitting: Oncology

## 2024-11-05 ENCOUNTER — Inpatient Hospital Stay: Attending: Oncology

## 2024-11-05 ENCOUNTER — Inpatient Hospital Stay: Admitting: Oncology

## 2024-11-05 VITALS — BP 182/88 | HR 73 | Temp 98.2°F | Resp 16 | Ht 62.0 in | Wt 128.8 lb

## 2024-11-05 DIAGNOSIS — N189 Chronic kidney disease, unspecified: Secondary | ICD-10-CM

## 2024-11-05 DIAGNOSIS — D631 Anemia in chronic kidney disease: Secondary | ICD-10-CM | POA: Insufficient documentation

## 2024-11-05 DIAGNOSIS — D508 Other iron deficiency anemias: Secondary | ICD-10-CM

## 2024-11-05 DIAGNOSIS — D509 Iron deficiency anemia, unspecified: Secondary | ICD-10-CM | POA: Insufficient documentation

## 2024-11-05 LAB — CMP (CANCER CENTER ONLY)
ALT: 13 U/L (ref 0–44)
AST: 24 U/L (ref 15–41)
Albumin: 4.2 g/dL (ref 3.5–5.0)
Alkaline Phosphatase: 149 U/L — ABNORMAL HIGH (ref 38–126)
Anion gap: 10 (ref 5–15)
BUN: 21 mg/dL (ref 8–23)
CO2: 25 mmol/L (ref 22–32)
Calcium: 9.8 mg/dL (ref 8.9–10.3)
Chloride: 106 mmol/L (ref 98–111)
Creatinine: 1.19 mg/dL — ABNORMAL HIGH (ref 0.44–1.00)
GFR, Estimated: 43 mL/min — ABNORMAL LOW
Glucose, Bld: 114 mg/dL — ABNORMAL HIGH (ref 70–99)
Potassium: 4.1 mmol/L (ref 3.5–5.1)
Sodium: 141 mmol/L (ref 135–145)
Total Bilirubin: 0.5 mg/dL (ref 0.0–1.2)
Total Protein: 7.6 g/dL (ref 6.5–8.1)

## 2024-11-05 LAB — CBC WITH DIFFERENTIAL (CANCER CENTER ONLY)
Abs Immature Granulocytes: 0.02 K/uL (ref 0.00–0.07)
Basophils Absolute: 0 K/uL (ref 0.0–0.1)
Basophils Relative: 0 %
Eosinophils Absolute: 0.1 K/uL (ref 0.0–0.5)
Eosinophils Relative: 3 %
HCT: 32.7 % — ABNORMAL LOW (ref 36.0–46.0)
Hemoglobin: 10.3 g/dL — ABNORMAL LOW (ref 12.0–15.0)
Immature Granulocytes: 0 %
Lymphocytes Relative: 25 %
Lymphs Abs: 1.3 K/uL (ref 0.7–4.0)
MCH: 26.2 pg (ref 26.0–34.0)
MCHC: 31.5 g/dL (ref 30.0–36.0)
MCV: 83.2 fL (ref 80.0–100.0)
Monocytes Absolute: 0.6 K/uL (ref 0.1–1.0)
Monocytes Relative: 12 %
Neutro Abs: 3.2 K/uL (ref 1.7–7.7)
Neutrophils Relative %: 60 %
Platelet Count: 255 K/uL (ref 150–400)
RBC: 3.93 MIL/uL (ref 3.87–5.11)
RDW: 16.1 % — ABNORMAL HIGH (ref 11.5–15.5)
WBC Count: 5.3 K/uL (ref 4.0–10.5)
nRBC: 0 % (ref 0.0–0.2)

## 2024-11-05 LAB — IRON AND TIBC
Iron: 88 ug/dL (ref 28–170)
Saturation Ratios: 27 % (ref 10.4–31.8)
TIBC: 322 ug/dL (ref 250–450)
UIBC: 234 ug/dL

## 2024-11-05 LAB — FERRITIN: Ferritin: 200 ng/mL (ref 11–307)

## 2024-11-06 LAB — SOLUBLE TRANSFERRIN RECEPTOR: Transferrin Receptor: 23 nmol/L (ref 12.2–27.3)

## 2025-03-05 ENCOUNTER — Inpatient Hospital Stay

## 2025-03-05 ENCOUNTER — Inpatient Hospital Stay: Admitting: Oncology
